# Patient Record
Sex: Female | Born: 1955 | Race: White | Hispanic: No | Marital: Married | Smoking: Former smoker
Health system: Southern US, Community
[De-identification: ages and names within clinical notes are randomized; demographics above are authoritative.]

## PROBLEM LIST (undated history)

## (undated) DIAGNOSIS — J40 Bronchitis, not specified as acute or chronic: Secondary | ICD-10-CM

## (undated) DIAGNOSIS — T7840XA Allergy, unspecified, initial encounter: Secondary | ICD-10-CM

## (undated) DIAGNOSIS — IMO0002 Reserved for concepts with insufficient information to code with codable children: Secondary | ICD-10-CM

## (undated) DIAGNOSIS — I10 Essential (primary) hypertension: Secondary | ICD-10-CM

## (undated) DIAGNOSIS — M1990 Unspecified osteoarthritis, unspecified site: Secondary | ICD-10-CM

## (undated) DIAGNOSIS — N644 Mastodynia: Secondary | ICD-10-CM

## (undated) DIAGNOSIS — F32A Depression, unspecified: Secondary | ICD-10-CM

## (undated) DIAGNOSIS — K529 Noninfective gastroenteritis and colitis, unspecified: Secondary | ICD-10-CM

## (undated) DIAGNOSIS — K469 Unspecified abdominal hernia without obstruction or gangrene: Secondary | ICD-10-CM

## (undated) DIAGNOSIS — K219 Gastro-esophageal reflux disease without esophagitis: Secondary | ICD-10-CM

## (undated) DIAGNOSIS — F329 Major depressive disorder, single episode, unspecified: Secondary | ICD-10-CM

## (undated) DIAGNOSIS — R87612 Low grade squamous intraepithelial lesion on cytologic smear of cervix (LGSIL): Secondary | ICD-10-CM

## (undated) DIAGNOSIS — M2410 Other articular cartilage disorders, unspecified site: Secondary | ICD-10-CM

## (undated) DIAGNOSIS — F419 Anxiety disorder, unspecified: Secondary | ICD-10-CM

## (undated) DIAGNOSIS — M069 Rheumatoid arthritis, unspecified: Secondary | ICD-10-CM

## (undated) DIAGNOSIS — Z8719 Personal history of other diseases of the digestive system: Secondary | ICD-10-CM

## (undated) DIAGNOSIS — L4050 Arthropathic psoriasis, unspecified: Secondary | ICD-10-CM

## (undated) DIAGNOSIS — Z8489 Family history of other specified conditions: Secondary | ICD-10-CM

## (undated) HISTORY — DX: Other articular cartilage disorders, unspecified site: M24.10

## (undated) HISTORY — DX: Essential (primary) hypertension: I10

## (undated) HISTORY — DX: Allergy, unspecified, initial encounter: T78.40XA

## (undated) HISTORY — PX: COLPOSCOPY: SHX161

## (undated) HISTORY — DX: Noninfective gastroenteritis and colitis, unspecified: K52.9

## (undated) HISTORY — DX: Reserved for concepts with insufficient information to code with codable children: IMO0002

## (undated) HISTORY — DX: Unspecified osteoarthritis, unspecified site: M19.90

## (undated) HISTORY — DX: Low grade squamous intraepithelial lesion on cytologic smear of cervix (LGSIL): R87.612

## (undated) HISTORY — PX: SPINE SURGERY: SHX786

## (undated) HISTORY — PX: JOINT REPLACEMENT: SHX530

## (undated) HISTORY — DX: Unspecified abdominal hernia without obstruction or gangrene: K46.9

## (undated) HISTORY — DX: Mastodynia: N64.4

## (undated) HISTORY — DX: Gastro-esophageal reflux disease without esophagitis: K21.9

---

## 1984-08-07 HISTORY — PX: ABDOMINAL HYSTERECTOMY: SHX81

## 2005-08-07 DIAGNOSIS — I10 Essential (primary) hypertension: Secondary | ICD-10-CM

## 2005-08-07 DIAGNOSIS — K529 Noninfective gastroenteritis and colitis, unspecified: Secondary | ICD-10-CM

## 2005-08-07 DIAGNOSIS — K469 Unspecified abdominal hernia without obstruction or gangrene: Secondary | ICD-10-CM

## 2005-08-07 HISTORY — PX: POLYPECTOMY: SHX149

## 2005-08-07 HISTORY — DX: Unspecified abdominal hernia without obstruction or gangrene: K46.9

## 2005-08-07 HISTORY — DX: Noninfective gastroenteritis and colitis, unspecified: K52.9

## 2005-08-07 HISTORY — DX: Essential (primary) hypertension: I10

## 2005-08-07 HISTORY — PX: CHOLECYSTECTOMY: SHX55

## 2005-11-27 ENCOUNTER — Ambulatory Visit: Payer: Self-pay | Admitting: Internal Medicine

## 2006-01-24 ENCOUNTER — Ambulatory Visit: Payer: Self-pay | Admitting: Internal Medicine

## 2006-01-25 ENCOUNTER — Ambulatory Visit: Payer: Self-pay | Admitting: Surgery

## 2006-04-24 ENCOUNTER — Ambulatory Visit: Payer: Self-pay | Admitting: Gastroenterology

## 2006-06-16 ENCOUNTER — Other Ambulatory Visit: Payer: Self-pay

## 2006-06-16 ENCOUNTER — Emergency Department: Payer: Self-pay | Admitting: Emergency Medicine

## 2008-08-07 HISTORY — PX: COLONOSCOPY: SHX174

## 2008-08-07 HISTORY — PX: UPPER GI ENDOSCOPY: SHX6162

## 2009-01-19 ENCOUNTER — Emergency Department (HOSPITAL_COMMUNITY): Admission: EM | Admit: 2009-01-19 | Discharge: 2009-01-19 | Payer: Self-pay | Admitting: Internal Medicine

## 2009-04-26 ENCOUNTER — Encounter: Admission: RE | Admit: 2009-04-26 | Discharge: 2009-04-26 | Payer: Self-pay | Admitting: Gastroenterology

## 2009-07-21 ENCOUNTER — Ambulatory Visit (HOSPITAL_COMMUNITY): Admission: RE | Admit: 2009-07-21 | Discharge: 2009-07-21 | Payer: Self-pay | Admitting: General Surgery

## 2009-08-23 ENCOUNTER — Emergency Department (HOSPITAL_COMMUNITY): Admission: EM | Admit: 2009-08-23 | Discharge: 2009-08-23 | Payer: Self-pay | Admitting: Emergency Medicine

## 2010-06-15 ENCOUNTER — Ambulatory Visit: Payer: Self-pay | Admitting: Family Medicine

## 2010-07-14 ENCOUNTER — Encounter
Admission: RE | Admit: 2010-07-14 | Discharge: 2010-07-14 | Payer: Self-pay | Source: Home / Self Care | Attending: Neurosurgery | Admitting: Neurosurgery

## 2010-08-03 ENCOUNTER — Encounter
Admission: RE | Admit: 2010-08-03 | Discharge: 2010-08-03 | Payer: Self-pay | Source: Home / Self Care | Attending: Neurosurgery | Admitting: Neurosurgery

## 2010-08-07 DIAGNOSIS — IMO0002 Reserved for concepts with insufficient information to code with codable children: Secondary | ICD-10-CM

## 2010-08-07 HISTORY — DX: Reserved for concepts with insufficient information to code with codable children: IMO0002

## 2010-10-09 ENCOUNTER — Ambulatory Visit: Payer: Self-pay | Admitting: Internal Medicine

## 2010-10-23 LAB — COMPREHENSIVE METABOLIC PANEL
ALT: 27 U/L (ref 0–35)
AST: 27 U/L (ref 0–37)
Albumin: 4.2 g/dL (ref 3.5–5.2)
Alkaline Phosphatase: 81 U/L (ref 39–117)
BUN: 8 mg/dL (ref 6–23)
CO2: 24 mEq/L (ref 19–32)
Calcium: 9.3 mg/dL (ref 8.4–10.5)
Chloride: 102 mEq/L (ref 96–112)
Creatinine, Ser: 0.68 mg/dL (ref 0.4–1.2)
GFR calc non Af Amer: >60 mL/min (ref >60)
Potassium: 3.4 mEq/L — ABNORMAL LOW (ref 3.5–5.1)
Total Bilirubin: 0.5 mg/dL (ref 0.3–1.2)

## 2010-10-23 LAB — POCT CARDIAC MARKERS
CKMB, poc: 1.8 ng/mL (ref 1.0–8.0)
Myoglobin, poc: 71.8 ng/mL (ref 12–200)
Troponin i, poc: <0.05 ng/mL (ref 0.00–0.09)

## 2010-10-23 LAB — LIPASE, BLOOD: Lipase: 22 U/L (ref 11–59)

## 2010-11-14 LAB — COMPREHENSIVE METABOLIC PANEL
ALT: 18 U/L (ref 0–35)
BUN: 9 mg/dL (ref 6–23)
Chloride: 100 mEq/L (ref 96–112)
Creatinine, Ser: 0.75 mg/dL (ref 0.4–1.2)
GFR calc Af Amer: >60 mL/min (ref >60)
Potassium: 3.5 mEq/L (ref 3.5–5.1)
Sodium: 133 mEq/L — ABNORMAL LOW (ref 135–145)
Total Bilirubin: 0.5 mg/dL (ref 0.3–1.2)
Total Protein: 7.7 g/dL (ref 6.0–8.3)

## 2010-11-14 LAB — LIPASE, BLOOD: Lipase: 25 U/L (ref 11–59)

## 2010-11-14 LAB — CBC
HCT: 41.3 % (ref 36.0–46.0)
MCV: 92.1 fL (ref 78.0–100.0)
Platelets: 273 10*3/uL (ref 150–400)
RBC: 4.49 MIL/uL (ref 3.87–5.11)
RDW: 12.5 % (ref 11.5–15.5)

## 2010-11-14 LAB — DIFFERENTIAL
Eosinophils Absolute: 0.1 10*3/uL (ref 0.0–0.7)
Lymphocytes Relative: 32 % (ref 12–46)
Lymphs Abs: 3.1 10*3/uL (ref 0.7–4.0)
Neutro Abs: 5.8 10*3/uL (ref 1.7–7.7)

## 2010-11-14 LAB — POCT I-STAT, CHEM 8
BUN: 10 mg/dL (ref 6–23)
Calcium, Ion: 1.06 mmol/L — ABNORMAL LOW (ref 1.12–1.32)
Chloride: 103 mEq/L (ref 96–112)
Glucose, Bld: 109 mg/dL — ABNORMAL HIGH (ref 70–99)
Hemoglobin: 14.6 g/dL (ref 12.0–15.0)
Potassium: 4.1 mEq/L (ref 3.5–5.1)
Sodium: 133 mEq/L — ABNORMAL LOW (ref 135–145)
TCO2: 24 mmol/L (ref 0–100)

## 2011-02-17 ENCOUNTER — Other Ambulatory Visit: Payer: Self-pay | Admitting: Neurosurgery

## 2011-02-17 DIAGNOSIS — M549 Dorsalgia, unspecified: Secondary | ICD-10-CM

## 2011-02-19 ENCOUNTER — Ambulatory Visit
Admission: RE | Admit: 2011-02-19 | Discharge: 2011-02-19 | Disposition: A | Discharge status: Home / Self Care | Payer: 59 | Source: Ambulatory Visit | Attending: Neurosurgery | Admitting: Neurosurgery

## 2011-02-19 DIAGNOSIS — M549 Dorsalgia, unspecified: Secondary | ICD-10-CM

## 2011-11-11 IMAGING — RF DG ESOPHAGUS
18 of 24 series · 18 of 24 positions shown · non-contrast
Comparison: CT abdomen 04/26/2009.

CLINICAL DATA: 53-year-old female who was diagnosed with a
collision on the basis of abnormal esophageal manometry.  Chest
chronic pain located between the breast with eating.  She feels
that solids and liquids thick in her esophagus.  She is a known
small hiatal hernia.

ESOPHOGRAM/BARIUM SWALLOW
TECHNIQUE: Combined double contrast and single contrast
examination performed using effervescent crystals, thick barium
liquid, and thin barium liquid.
Fluoroscopy time:  3.8 minutes.

[Series 1: run · 1 of 1 slices shown (1 of 18)]
[im 1/1]
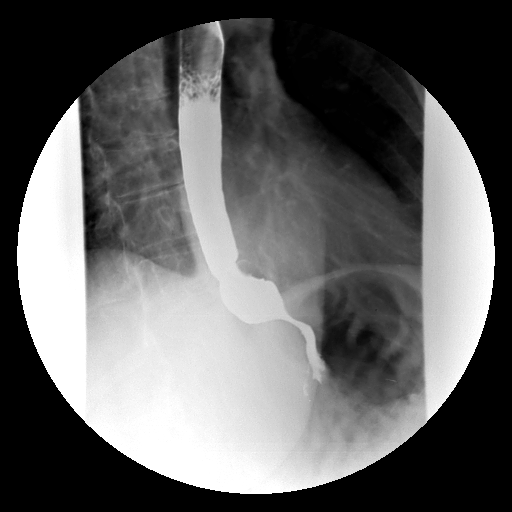

[Series 3: run · 1 of 1 slices shown (2 of 18)]
[im 1/1]
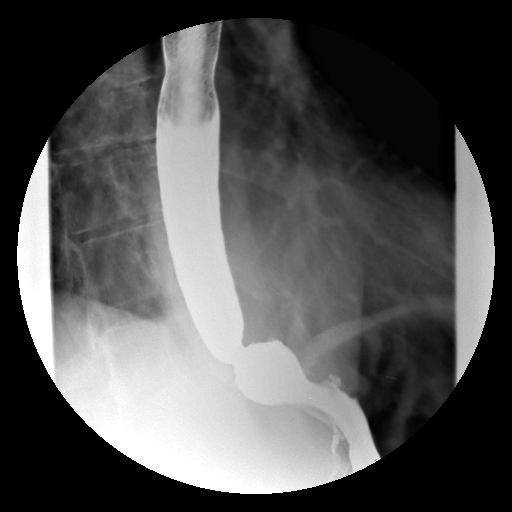

[Series 4: run · 1 of 1 slices shown (3 of 18)]
[im 1/1]
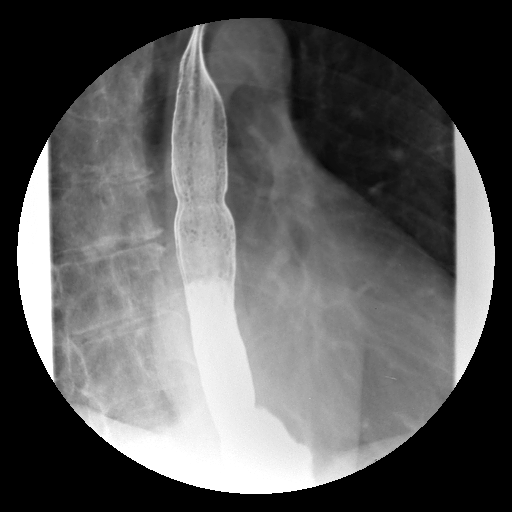

[Series 5: run · 1 of 1 slices shown (4 of 18)]
[im 1/1]
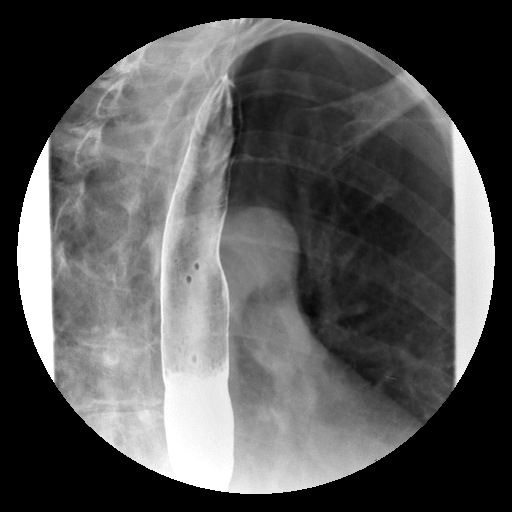

[Series 7: run · 1 of 1 slices shown (5 of 18)]
[im 1/1]
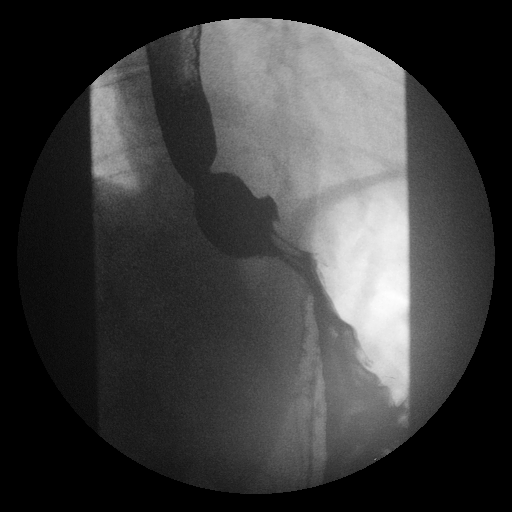

[Series 8: run · 1 of 1 slices shown (6 of 18)]
[im 1/1]
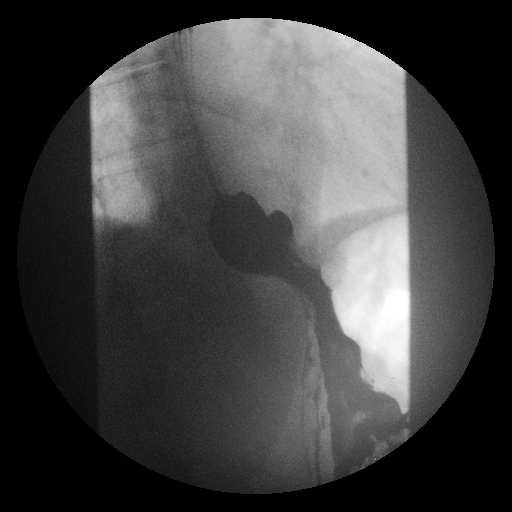

[Series 9: run · 1 of 5 slices shown (7 of 18)]
[im 1/5]
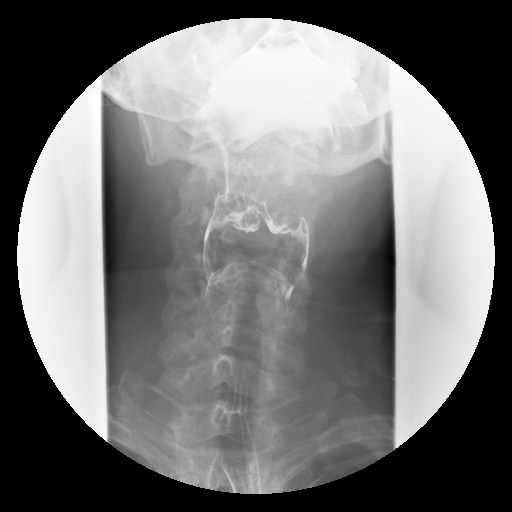

[Series 11: run · 1 of 5 slices shown (8 of 18)]
[im 1/5]
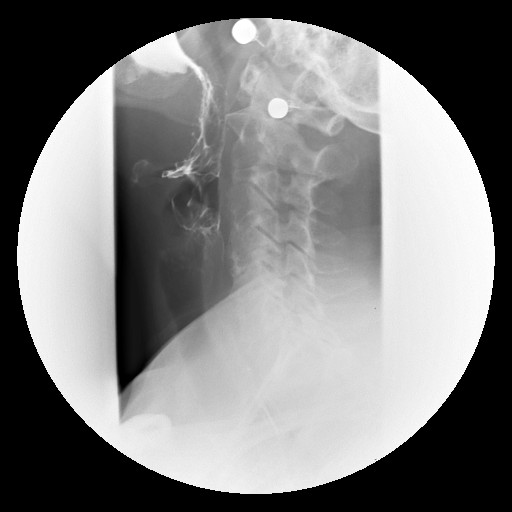

[Series 12: run · 1 of 1 slices shown (9 of 18)]
[im 1/1]
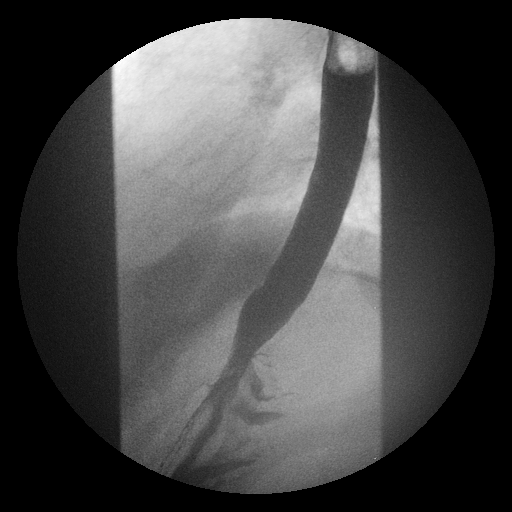

[Series 13: run · 1 of 1 slices shown (10 of 18)]
[im 1/1]
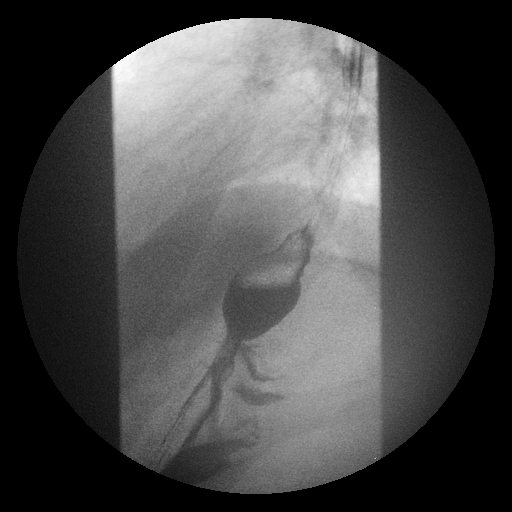

[Series 15: run · 1 of 1 slices shown (11 of 18)]
[im 1/1]
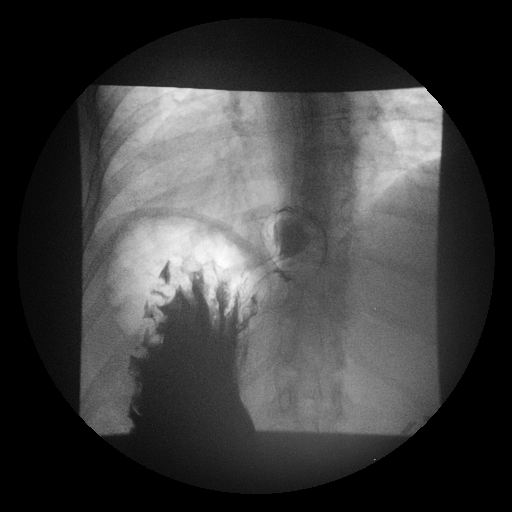

[Series 16: run · 1 of 1 slices shown (12 of 18)]
[im 1/1]
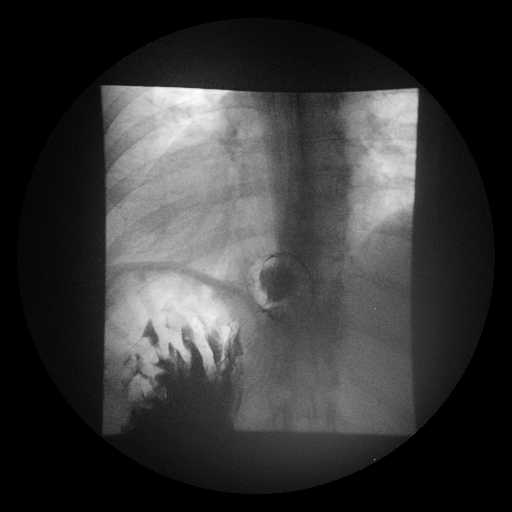

[Series 17: run · 1 of 1 slices shown (13 of 18)]
[im 1/1]
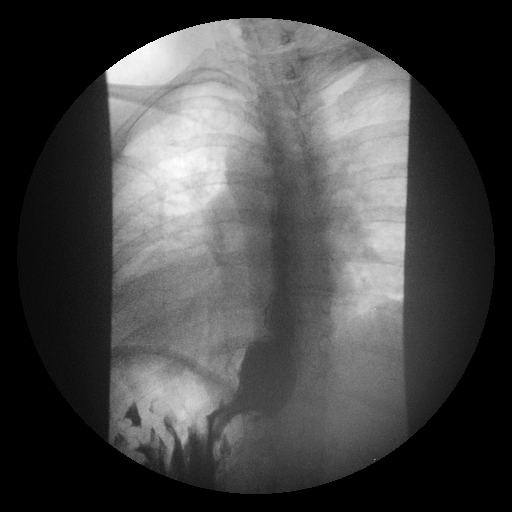

[Series 19: run · 1 of 1 slices shown (14 of 18)]
[im 1/1]
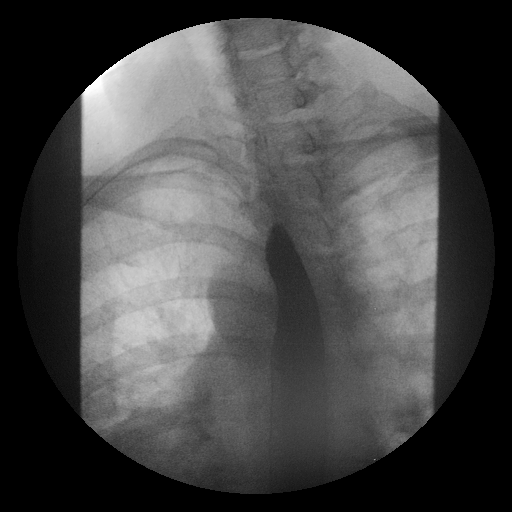

[Series 20: run · 1 of 1 slices shown (15 of 18)]
[im 1/1]
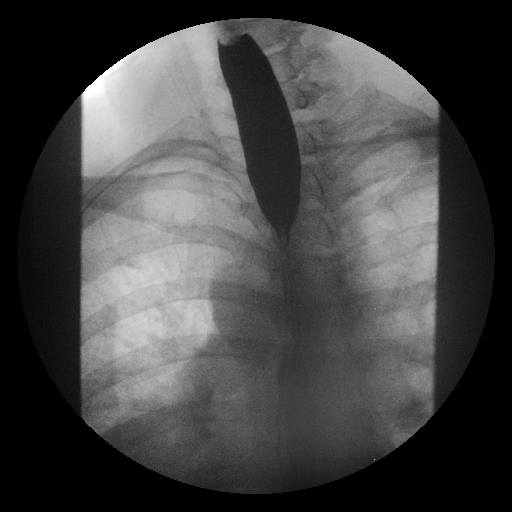

[Series 21: run · 1 of 1 slices shown (16 of 18)]
[im 1/1]
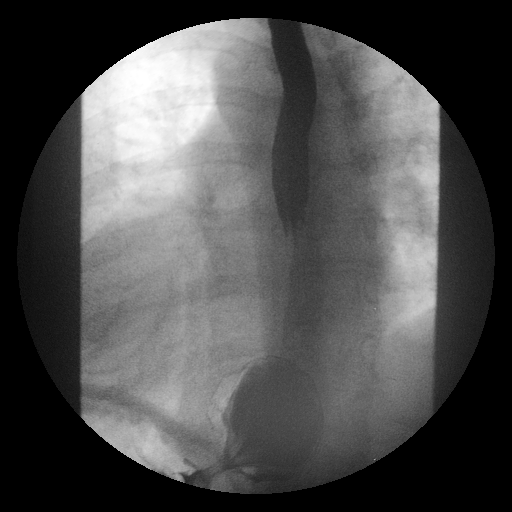

[Series 23: run · 1 of 1 slices shown (17 of 18)]
[im 1/1]
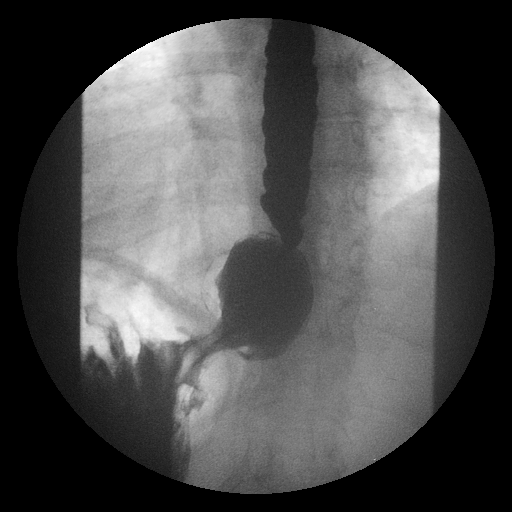

[Series 24: run · 1 of 1 slices shown (18 of 18)]
[im 1/1]
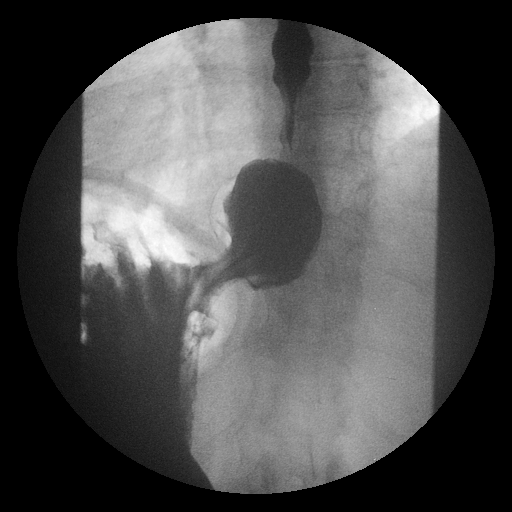

[18 of 24 positions shown; findings below may reference images not displayed]

FINDINGS: The patient tolerated the double contrast study well
without difficulty.  There is no obstruction of the forward flow of
contrast throughout the esophagus to the gastroesophageal junction.
The gastroesophageal junction is within normal limits.  Esophageal
mucosal pattern is within normal limits.

A small hiatal hernia was intermittently noted throughout the
study.  This appeared largest on the supine images of the abdomen
(image 14). This appears to be a sliding hernia, and it became
smaller and ultimately resolved at the conclusion of the study on
the upright images (image 36).  However, when present, contrast did
hang up in the hernia, and the neck of the hernia appeared
relatively stenotic.  A 12.5 mm barium tablet paused at the level
of the aortic arch impression in the upper thoracic esophagus, but
then passed immediately into the stomach.

No gastroesophageal reflux was elicited despite maneuvers.  Rapid
sequence imaging of the cervical esophagus is normal.  Esophageal
motility is within normal limits.  Visualized gastric contour is
unremarkable.  There is gastric emptying to the small bowel noted.
IMPRESSION: 1.  Intermittently seen (sliding) hiatal hernia, largest during
supine positioning.  While the most often these are thought to be
asymptomatic, there was mild obstruction of the forward flow of
contrast at the hernia neck.
2.  Normal esophagus and normal gastroesophageal junction.
3.  No gastroesophageal reflux elicited.

## 2012-02-01 DIAGNOSIS — IMO0002 Reserved for concepts with insufficient information to code with codable children: Secondary | ICD-10-CM

## 2012-02-01 HISTORY — DX: Reserved for concepts with insufficient information to code with codable children: IMO0002

## 2012-07-18 DIAGNOSIS — R87612 Low grade squamous intraepithelial lesion on cytologic smear of cervix (LGSIL): Secondary | ICD-10-CM

## 2012-07-18 HISTORY — DX: Low grade squamous intraepithelial lesion on cytologic smear of cervix (LGSIL): R87.612

## 2012-08-07 DIAGNOSIS — M1990 Unspecified osteoarthritis, unspecified site: Secondary | ICD-10-CM

## 2012-08-07 HISTORY — DX: Unspecified osteoarthritis, unspecified site: M19.90

## 2012-10-22 ENCOUNTER — Other Ambulatory Visit: Payer: Self-pay | Admitting: Neurosurgery

## 2012-10-22 DIAGNOSIS — M47816 Spondylosis without myelopathy or radiculopathy, lumbar region: Secondary | ICD-10-CM

## 2012-10-24 ENCOUNTER — Ambulatory Visit
Admission: RE | Admit: 2012-10-24 | Discharge: 2012-10-24 | Disposition: A | Discharge status: Home / Self Care | Payer: 59 | Source: Ambulatory Visit | Attending: Neurosurgery | Admitting: Neurosurgery

## 2012-10-24 ENCOUNTER — Other Ambulatory Visit: Payer: 59

## 2012-10-24 DIAGNOSIS — M47816 Spondylosis without myelopathy or radiculopathy, lumbar region: Secondary | ICD-10-CM

## 2012-10-24 MED ORDER — IOHEXOL 180 MG/ML  SOLN
1.0000 mL | Freq: Once | INTRAMUSCULAR | Status: AC | PRN
  Administered 2012-10-24: 1 mL via EPIDURAL

## 2012-12-27 HISTORY — PX: BACK SURGERY: SHX140

## 2013-01-12 ENCOUNTER — Ambulatory Visit: Payer: Self-pay | Admitting: Internal Medicine

## 2013-03-25 ENCOUNTER — Ambulatory Visit: Payer: Self-pay | Admitting: General Surgery

## 2013-06-13 ENCOUNTER — Ambulatory Visit
Admission: RE | Admit: 2013-06-13 | Discharge: 2013-06-13 | Disposition: A | Discharge status: Home / Self Care | Payer: 59 | Source: Ambulatory Visit | Attending: Neurosurgery | Admitting: Neurosurgery

## 2013-06-13 ENCOUNTER — Other Ambulatory Visit: Payer: Self-pay | Admitting: Neurosurgery

## 2013-06-13 DIAGNOSIS — M25551 Pain in right hip: Secondary | ICD-10-CM

## 2013-06-13 DIAGNOSIS — M76899 Other specified enthesopathies of unspecified lower limb, excluding foot: Secondary | ICD-10-CM

## 2013-08-24 ENCOUNTER — Ambulatory Visit: Payer: Self-pay | Admitting: Physician Assistant

## 2014-07-06 ENCOUNTER — Ambulatory Visit: Payer: Self-pay | Admitting: Podiatry

## 2014-07-28 ENCOUNTER — Ambulatory Visit: Payer: Self-pay

## 2014-07-28 LAB — RAPID STREP-A WITH REFLX: Micro Text Report: NEGATIVE

## 2014-07-29 ENCOUNTER — Ambulatory Visit: Payer: Self-pay | Admitting: Podiatry

## 2014-07-31 LAB — BETA STREP CULTURE(ARMC)

## 2014-08-10 ENCOUNTER — Ambulatory Visit: Payer: Self-pay | Admitting: Podiatry

## 2014-08-17 ENCOUNTER — Ambulatory Visit (INDEPENDENT_AMBULATORY_CARE_PROVIDER_SITE_OTHER): Payer: 59 | Admitting: Podiatry

## 2014-08-17 ENCOUNTER — Ambulatory Visit (INDEPENDENT_AMBULATORY_CARE_PROVIDER_SITE_OTHER): Payer: 59

## 2014-08-17 ENCOUNTER — Encounter: Payer: Self-pay | Admitting: Podiatry

## 2014-08-17 VITALS — BP 121/75 | HR 70 | Resp 16 | Ht 64.0 in | Wt 175.0 lb

## 2014-08-17 DIAGNOSIS — M21612 Bunion of left foot: Secondary | ICD-10-CM

## 2014-08-17 DIAGNOSIS — M2012 Hallux valgus (acquired), left foot: Secondary | ICD-10-CM

## 2014-08-17 DIAGNOSIS — M21622 Bunionette of left foot: Secondary | ICD-10-CM

## 2014-08-17 DIAGNOSIS — M205X2 Other deformities of toe(s) (acquired), left foot: Secondary | ICD-10-CM

## 2014-08-17 DIAGNOSIS — M2042 Other hammer toe(s) (acquired), left foot: Secondary | ICD-10-CM

## 2014-08-17 DIAGNOSIS — G5762 Lesion of plantar nerve, left lower limb: Secondary | ICD-10-CM

## 2014-08-17 DIAGNOSIS — G5782 Other specified mononeuropathies of left lower limb: Secondary | ICD-10-CM

## 2014-08-17 NOTE — Progress Notes (Signed)
   Subjective:    Patient ID: Breanna Farrell, female    DOB: Feb 19, 1956, 59 y.o.   MRN: [id number]  HPI Comments: i have bunions on my left foot. Ive had them for years. Shoes will hurt my foot. My foot will draw up and my toes are turning like hammer toes. It does hurt to walk, stand and sit. Its a constant pain. My foot is getting worse. i try to stay off my foot.     Review of Systems  HENT: Positive for sinus pressure.   All other systems reviewed and are negative.      Objective:   Physical Exam: I have reviewed her past medical history medications out a surgery social history and review of systems. Pulses are strongly palpable bilateral. Neurologic sensorium is intact per Semmes-Weinstein monofilament. Deep tendon reflexes are intact bilateral muscle strength +5 over 5 dorsiflexes plantar flexors and inverters everters onto the musculature is intact. Orthopedic evaluation does demonstrate hallux abductovalgus deformity left greater than right tailor's bunion deformity left greater than right. Palpable Mulder's click third interdigital space of the left foot. 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 Radiographically evaluation confirms these deformities. Cutaneous evaluation demonstrates supple well-hydrated cutis no erythema edema cellulitis drainage odor or skin breakdown.        Assessment & Plan:  Assessment: Hallux valgus left. 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  Plan: We discussed the etiology pathology conservative versus surgical therapies. I encouraged an injection to the neuroma. However this point due to the inability for her to continue daily activities with her foot in this manner she is requesting surgical intervention. We went over a consent form today line by line number by number giving her ample time to ask questions she suffered regarding [place] bunion repair left foot fifth  metatarsal osteotomy left foot. Neurectomy heard interdigital space left foot. Hammertoe repair with screws or pins second and third toes left foot. I answered all the questions regarding these procedure to the best of my ability in layman's terms area they understood it were amenable to it and signed all 3 pages of the consent form. She was dispensed a cam walker for her left lower extremity and I will follow-up with her in the near future.

## 2014-09-30 ENCOUNTER — Other Ambulatory Visit: Payer: Self-pay | Admitting: Podiatry

## 2014-09-30 MED ORDER — OXYCODONE-ACETAMINOPHEN 10-325 MG PO TABS: ORAL_TABLET | ORAL | Status: DC

## 2014-09-30 MED ORDER — PROMETHAZINE HCL 25 MG PO TABS: 25.0000 mg | ORAL_TABLET | Freq: Three times a day (TID) | ORAL | Status: DC | PRN

## 2014-09-30 MED ORDER — CEPHALEXIN 500 MG PO CAPS: 500.0000 mg | ORAL_CAPSULE | Freq: Three times a day (TID) | ORAL | Status: DC

## 2014-10-02 ENCOUNTER — Encounter: Payer: Self-pay | Admitting: Podiatry

## 2014-10-02 DIAGNOSIS — M21542 Acquired clubfoot, left foot: Secondary | ICD-10-CM

## 2014-10-02 DIAGNOSIS — M2012 Hallux valgus (acquired), left foot: Secondary | ICD-10-CM

## 2014-10-02 DIAGNOSIS — M2042 Other hammer toe(s) (acquired), left foot: Secondary | ICD-10-CM

## 2014-10-07 ENCOUNTER — Ambulatory Visit (INDEPENDENT_AMBULATORY_CARE_PROVIDER_SITE_OTHER): Payer: 59 | Admitting: Podiatry

## 2014-10-07 ENCOUNTER — Ambulatory Visit (INDEPENDENT_AMBULATORY_CARE_PROVIDER_SITE_OTHER): Payer: 59

## 2014-10-07 VITALS — BP 165/78 | HR 56 | Temp 98.3°F | Resp 18

## 2014-10-07 DIAGNOSIS — Z9889 Other specified postprocedural states: Secondary | ICD-10-CM

## 2014-10-07 DIAGNOSIS — M2012 Hallux valgus (acquired), left foot: Secondary | ICD-10-CM

## 2014-10-07 DIAGNOSIS — M21612 Bunion of left foot: Secondary | ICD-10-CM

## 2014-10-07 NOTE — Progress Notes (Signed)
She presents today for follow-up 1 week status post [place] bunion repair left hammertoe repair #2 #3 with pain in the left neurectomy third interdigital space left and a fifth metatarsal osteotomy left. She denies fever chills nausea vomiting. She states that she's had considerable pain about the left foot.  Objective: She presents today ambulating slowly. Vital signs are stable she is alert and oriented 3. Pulses are palpable once a dry sterile dressing was removed. Minimal edema no erythema cellulitis drainage or odor K wires are in place sutures are in good position and well coapted margins. Radiograph demonstrates well-healing surgical foot.  Assessment: Well-healing surgical foot left.  Plan: I will follow-up with her in 1 week at this point we redressed the foot today with a dry sterile compressive dressing consider suture removal next visit.

## 2014-10-14 ENCOUNTER — Encounter: Payer: Self-pay | Admitting: Podiatry

## 2014-10-14 ENCOUNTER — Ambulatory Visit (INDEPENDENT_AMBULATORY_CARE_PROVIDER_SITE_OTHER): Payer: 59 | Admitting: Podiatry

## 2014-10-14 VITALS — BP 111/61 | HR 80 | Resp 16

## 2014-10-14 DIAGNOSIS — M2012 Hallux valgus (acquired), left foot: Secondary | ICD-10-CM

## 2014-10-14 DIAGNOSIS — Z9889 Other specified postprocedural states: Secondary | ICD-10-CM

## 2014-10-14 DIAGNOSIS — M205X2 Other deformities of toe(s) (acquired), left foot: Secondary | ICD-10-CM

## 2014-10-14 DIAGNOSIS — M2042 Other hammer toe(s) (acquired), left foot: Secondary | ICD-10-CM

## 2014-10-14 DIAGNOSIS — M21622 Bunionette of left foot: Secondary | ICD-10-CM

## 2014-10-14 MED ORDER — PROMETHAZINE HCL 25 MG PO TABS: 25.0000 mg | ORAL_TABLET | Freq: Three times a day (TID) | ORAL | Status: DC | PRN

## 2014-10-14 MED ORDER — HYDROCODONE-ACETAMINOPHEN 5-325 MG PO TABS: 1.0000 | ORAL_TABLET | Freq: Four times a day (QID) | ORAL | Status: DC | PRN

## 2014-10-14 NOTE — Progress Notes (Signed)
She presents today 2 weeks status post osteotomy bunion repair fifth metatarsal osteotomy and hammertoe repair #2 #3 of the left foot. She denies fever chills nausea by muscle aches and pains that she states the Percocet seems to be bothering her stomach a little bit.  Objective: Vital signs are stable she is alert and oriented 3 presents in her boot today with a dry sterile dressing intact. Was removed demonstrates no erythema saline as drainage or odor. Pins are intact to his #2 and 3 left foot. Sutures are intact margins appear to be well coapted. Sutures were removed today.  Assessment: Well-healing surgical toes and foot left.  Plan: The compression anklet [place] she will follow up with her in 1-2 weeks and wrote another prescription for Vicodin and Phenergan.

## 2014-10-23 ENCOUNTER — Encounter: Payer: Self-pay | Admitting: Podiatry

## 2014-10-26 ENCOUNTER — Ambulatory Visit (INDEPENDENT_AMBULATORY_CARE_PROVIDER_SITE_OTHER): Payer: 59

## 2014-10-26 ENCOUNTER — Ambulatory Visit (INDEPENDENT_AMBULATORY_CARE_PROVIDER_SITE_OTHER): Payer: 59 | Admitting: Podiatry

## 2014-10-26 VITALS — BP 130/75 | HR 81 | Resp 16

## 2014-10-26 DIAGNOSIS — M2012 Hallux valgus (acquired), left foot: Secondary | ICD-10-CM

## 2014-10-26 NOTE — Progress Notes (Signed)
She presents today one month status post bunion repair hammertoe repair #2 and #3 of the left foot. She's been wearing her ankle-foot the entire time since I gave it to her. She has not taken it off. She states that her foot is in excruciating pain.  Objective: Vital signs are stable she's alert and oriented 3. Pulses are palpable bilateral. She has moderate edema to the dorsal aspect of the left foot with mild tenderness. Radiographs do demonstrate well-healing osteotomy sites and arthrodesis sites.  Assessment: Painful postoperative foot left secondary to leaving the anklet on for the past 2 weeks.  Plan: I would allow her to start washing this foot and soaking it. We will leave the pins in for another 2 weeks.

## 2014-10-28 ENCOUNTER — Encounter: Payer: 59 | Admitting: Podiatry

## 2014-10-28 NOTE — Progress Notes (Signed)
DOS 10/02/2014 Austin Bunion Repair left; 5th Metata4rsal Osteotomy lef, Neurectomy 3rd interdigital space left, Hammer Toe Repair with screw/pins left 2nd, 3rd.

## 2014-11-09 ENCOUNTER — Ambulatory Visit (INDEPENDENT_AMBULATORY_CARE_PROVIDER_SITE_OTHER): Payer: 59 | Admitting: Podiatry

## 2014-11-09 ENCOUNTER — Encounter: Payer: Self-pay | Admitting: Podiatry

## 2014-11-09 ENCOUNTER — Ambulatory Visit (INDEPENDENT_AMBULATORY_CARE_PROVIDER_SITE_OTHER): Payer: 59

## 2014-11-09 VITALS — BP 123/71 | HR 75 | Resp 16 | Ht 64.0 in | Wt 175.0 lb

## 2014-11-09 DIAGNOSIS — Z9889 Other specified postprocedural states: Secondary | ICD-10-CM

## 2014-11-09 DIAGNOSIS — M2012 Hallux valgus (acquired), left foot: Secondary | ICD-10-CM

## 2014-11-09 DIAGNOSIS — M2042 Other hammer toe(s) (acquired), left foot: Secondary | ICD-10-CM

## 2014-11-09 NOTE — Progress Notes (Signed)
She presents today for follow-up left foot. She is approximately 6 weeks status post [place] bunion repair second metatarsal osteotomy with hammertoe repair #2 and #3 of the left foot with pins. She states that the foot is swollen because she's been on it and the foot still hurts with the wires in the foot.  Objective: Vital signs are stable she's alert and oriented 3. K wires are in place to the left foot toes #2 and #3 with some extrusion of the second K wire. Radiographs confirm near consolidation of the osteotomy sites and of the arthrodesis sites.  Assessment: Well-healing surgical foot left.  Plan: Removal of her K wires today compression dressing and will allow her to get started in tennis shoes after the next couple of weeks I will follow up with her in 1 month

## 2014-12-07 ENCOUNTER — Ambulatory Visit (INDEPENDENT_AMBULATORY_CARE_PROVIDER_SITE_OTHER): Payer: 59

## 2014-12-07 ENCOUNTER — Ambulatory Visit (INDEPENDENT_AMBULATORY_CARE_PROVIDER_SITE_OTHER): Payer: 59 | Admitting: Podiatry

## 2014-12-07 ENCOUNTER — Encounter: Payer: Self-pay | Admitting: Podiatry

## 2014-12-07 DIAGNOSIS — M205X2 Other deformities of toe(s) (acquired), left foot: Secondary | ICD-10-CM

## 2014-12-07 DIAGNOSIS — M2012 Hallux valgus (acquired), left foot: Secondary | ICD-10-CM | POA: Diagnosis not present

## 2014-12-07 DIAGNOSIS — Z9889 Other specified postprocedural states: Secondary | ICD-10-CM

## 2014-12-07 DIAGNOSIS — M2042 Other hammer toe(s) (acquired), left foot: Secondary | ICD-10-CM

## 2014-12-07 DIAGNOSIS — M21622 Bunionette of left foot: Secondary | ICD-10-CM

## 2014-12-07 NOTE — Progress Notes (Signed)
She presents today approximately 9 weeks status post [facility] bunion repair and hammertoe repair left. She also had a fifth metatarsal osteotomy. She states that she is having as much pain now as she was prior to surgery. She presents today with a pair of flat loosefitting shoes.  Objective: Vital signs are stable she is alert and oriented 3 she still has tenderness on range of motion of all the toes radiographs demonstrate well-healed osteotomies.  Assessment: Well-healed surgical foot left retention of swelling and edema and some erythema around the joint. This could be associated with her rheumatoid.  Plan: I encouraged her to get back on her rheumatoid medicine and I will follow-up with her in approximately a month in which time we will consider physical therapy.

## 2015-01-06 ENCOUNTER — Ambulatory Visit (INDEPENDENT_AMBULATORY_CARE_PROVIDER_SITE_OTHER): Payer: 59 | Admitting: Podiatry

## 2015-01-06 ENCOUNTER — Ambulatory Visit (INDEPENDENT_AMBULATORY_CARE_PROVIDER_SITE_OTHER): Payer: 59

## 2015-01-06 VITALS — BP 130/84 | HR 76 | Resp 16

## 2015-01-06 DIAGNOSIS — E119 Type 2 diabetes mellitus without complications: Secondary | ICD-10-CM | POA: Insufficient documentation

## 2015-01-06 DIAGNOSIS — M2012 Hallux valgus (acquired), left foot: Secondary | ICD-10-CM | POA: Diagnosis not present

## 2015-01-06 DIAGNOSIS — Z8719 Personal history of other diseases of the digestive system: Secondary | ICD-10-CM | POA: Insufficient documentation

## 2015-01-06 DIAGNOSIS — E118 Type 2 diabetes mellitus with unspecified complications: Secondary | ICD-10-CM | POA: Insufficient documentation

## 2015-01-06 DIAGNOSIS — Z9889 Other specified postprocedural states: Secondary | ICD-10-CM

## 2015-01-06 DIAGNOSIS — Z8739 Personal history of other diseases of the musculoskeletal system and connective tissue: Secondary | ICD-10-CM | POA: Insufficient documentation

## 2015-01-06 DIAGNOSIS — K209 Esophagitis, unspecified without bleeding: Secondary | ICD-10-CM | POA: Insufficient documentation

## 2015-01-06 DIAGNOSIS — I10 Essential (primary) hypertension: Secondary | ICD-10-CM | POA: Insufficient documentation

## 2015-01-06 DIAGNOSIS — E1169 Type 2 diabetes mellitus with other specified complication: Secondary | ICD-10-CM | POA: Insufficient documentation

## 2015-01-06 DIAGNOSIS — E785 Hyperlipidemia, unspecified: Secondary | ICD-10-CM | POA: Insufficient documentation

## 2015-01-06 NOTE — Progress Notes (Signed)
She presents today for final follow-up of her surgical foot left including full forefoot reconstruction. She states that she is doing much better now that she is back on her arthritis medication.  Objective: Vital signs are stable she's alert and oriented 3 much decrease in edema left foot. She has great range of motion. Radiographically her feet have gone on to heal quite nicely.  Assessment: Well-healing surgical foot left.  Plan: Follow up with An As-Needed basis.

## 2015-01-10 ENCOUNTER — Ambulatory Visit
Admission: EM | Admit: 2015-01-10 | Discharge: 2015-01-10 | Disposition: A | Discharge status: Home / Self Care | Payer: 59 | Attending: Family Medicine | Admitting: Family Medicine

## 2015-01-10 DIAGNOSIS — J069 Acute upper respiratory infection, unspecified: Secondary | ICD-10-CM | POA: Diagnosis not present

## 2015-01-10 DIAGNOSIS — Z8739 Personal history of other diseases of the musculoskeletal system and connective tissue: Secondary | ICD-10-CM

## 2015-01-10 DIAGNOSIS — J3089 Other allergic rhinitis: Secondary | ICD-10-CM

## 2015-01-10 MED ORDER — HYDROCOD POLST-CPM POLST ER 10-8 MG/5ML PO SUER: 5.0000 mL | Freq: Two times a day (BID) | ORAL | Status: DC | PRN

## 2015-01-10 MED ORDER — PREDNISONE 10 MG (21) PO TBPK: ORAL_TABLET | ORAL | Status: DC

## 2015-01-10 MED ORDER — AMOXICILLIN-POT CLAVULANATE 875-125 MG PO TABS: 1.0000 | ORAL_TABLET | Freq: Two times a day (BID) | ORAL | Status: DC

## 2015-01-10 MED ORDER — FEXOFENADINE-PSEUDOEPHED ER 180-240 MG PO TB24: 1.0000 | ORAL_TABLET | Freq: Every day | ORAL | Status: DC

## 2015-01-10 NOTE — Discharge Instructions (Signed)
If not improved next several days start the Augmentin anabiotic but cannot take with immune suppressor Allergies  Allergies may happen from anything your body is sensitive to. This may be food, medicines, pollens, chemicals, and many other things. Food allergies can be severe and deadly.  HOME CARE  If you do not know what causes a reaction, keep a diary. Write down the foods you ate and the symptoms that followed. Avoid foods that cause reactions.  If you have red raised spots (hives) or a rash:  Take medicine as told by your doctor.  Use medicines for red raised spots and itching as needed.  Apply cold cloths (compresses) to the skin. Take a cool bath. Avoid hot baths or showers.  If you are severely allergic:  It is often necessary to go to the hospital after you have treated your reaction.  Wear your medical alert jewelry.  You and your family must learn how to give a allergy shot or use an allergy kit (anaphylaxis kit).  Always carry your allergy kit or shot with you. Use this medicine as told by your doctor if a severe reaction is occurring. GET HELP RIGHT AWAY IF:  You have trouble breathing or are making high-pitched whistling sounds (wheezing).  You have a tight feeling in your chest or throat.  You have a puffy (swollen) mouth.  You have red raised spots, puffiness (swelling), or itching all over your body.  You have had a severe reaction that was helped by your allergy kit or shot. The reaction can return once the medicine has worn off.  You think you are having a food allergy. Symptoms most often happen within 30 minutes of eating a food.  Your symptoms have not gone away within 2 days or are getting worse.  You have new symptoms.  You want to retest yourself with a food or drink you think causes an allergic reaction. Only do this under the care of a doctor. MAKE SURE YOU:   Understand these instructions.  Will watch your condition.  Will get help right  away if you are not doing well or get worse. Document Released: 11/18/2012 Document Reviewed: 11/18/2012 [facility] Patient Information 2015 [place]. This information is not intended to replace advice given to you by your health care provider. Make sure you discuss any questions you have with your health care provider.  Cough, Adult  A cough is a reflex. It helps you clear your throat and airways. A cough can help heal your body. A cough can last 2 or 3 weeks (acute) or may last more than 8 weeks (chronic). Some common causes of a cough can include an infection, allergy, or a cold. HOME CARE  Only take medicine as told by your doctor.  If given, take your medicines (antibiotics) as told. Finish them even if you start to feel better.  Use a cold steam vaporizer or humidifier in your home. This can help loosen thick spit (secretions).  Sleep so you are almost sitting up (semi-upright). Use pillows to do this. This helps reduce coughing.  Rest as needed.  Stop smoking if you smoke. GET HELP RIGHT AWAY IF:  You have yellowish-white fluid (pus) in your thick spit.  Your cough gets worse.  Your medicine does not reduce coughing, and you are losing sleep.  You cough up blood.  You have trouble breathing.  Your pain gets worse and medicine does not help.  You have a fever. MAKE SURE YOU:   Understand  these instructions.  Will watch your condition.  Will get help right away if you are not doing well or get worse. Document Released: 04/06/2011 Document Revised: 12/08/2013 Document Reviewed: 04/06/2011 [facility] Patient Information 2015 [place], [place]. This information is not intended to replace advice given to you by your health care provider. Make sure you discuss any questions you have with your health care provider.  Upper Respiratory Infection, Adult An upper respiratory infection (URI) is also known as the common cold. It is often caused by a type of germ (virus). Colds  are easily spread (contagious). You can pass it to others by kissing, coughing, sneezing, or drinking out of the same glass. Usually, you get better in 1 or 2 weeks.  HOME CARE   Only take medicine as told by your doctor.  Use a warm mist humidifier or breathe in steam from a hot shower.  Drink enough water and fluids to keep your pee (urine) clear or pale yellow.  Get plenty of rest.  Return to work when your temperature is back to normal or as told by your doctor. You may use a face mask and wash your hands to stop your cold from spreading. GET HELP RIGHT AWAY IF:   After the first few days, you feel you are getting worse.  You have questions about your medicine.  You have chills, shortness of breath, or brown or red spit (mucus).  You have yellow or brown snot (nasal discharge) or pain in the face, especially when you bend forward.  You have a fever, puffy (swollen) neck, pain when you swallow, or white spots in the back of your throat.  You have a bad headache, ear pain, sinus pain, or chest pain.  You have a high-pitched whistling sound when you breathe in and out (wheezing).  You have a lasting cough or cough up blood.  You have sore muscles or a stiff neck. MAKE SURE YOU:   Understand these instructions.  Will watch your condition.  Will get help right away if you are not doing well or get worse. Document Released: 01/10/2008 Document Revised: 10/16/2011 Document Reviewed: 10/29/2013 [facility] Patient Information 2015 [place], [place]. This information is not intended to replace advice given to you by your health care provider. Make sure you discuss any questions you have with your health care provider. Marland Kitchen

## 2015-01-10 NOTE — ED Notes (Signed)
Patient with complaint of nasal drainage, cough, congestion for the past week off and on. Last night felt it more in chest.

## 2015-01-10 NOTE — ED Provider Notes (Signed)
CSN: [id number]     Arrival date & time 01/10/15  1115 History   First MD Initiated Contact with Patient 01/10/15 1157     Chief Complaint  Patient presents with  . Cough  . Nasal Congestion   (Consider location/radiation/quality/duration/timing/severity/associated sxs/prior Treatment) Patient is a 59 y.o. female presenting with cough and URI. The history is provided by the patient. No language interpreter was used.  Cough Cough characteristics:  Non-productive Severity:  Moderate Duration:  2 days Timing:  Intermittent Chronicity:  New Smoker: no   Context comment:  She is on immune modifier Relieved by:  Nothing Associated symptoms: rhinorrhea   Associated symptoms: no shortness of breath   Risk factors: no recent infection   URI Presenting symptoms: congestion, cough and rhinorrhea   Rhinorrhea:    Quality:  Clear   Severity:  Severe   Duration:  1 week   Timing:  Intermittent   Progression:  Worsening Severity:  Moderate Timing:  Constant Progression:  Worsening Relieved by:  Nothing Risk factors: immunosuppression   Risk factors: not elderly, no chronic cardiac disease, no chronic kidney disease, no chronic respiratory disease, no diabetes mellitus, no recent illness, no recent travel and no sick contacts     Patient states about a week ago started. Clear rhinorrhea she thinks is mostly allergies but is getting worse and is going down to the lungs as well. He is on immune modifier.  Past Medical History  Diagnosis Date  . 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 Hernia 2007    hiatal  . Hypertension 2007  . Heart murmur 2010  . Colitis 2007  . Lump or mass in breast   . Bulging discs 2012   Past Surgical History  Procedure Laterality Date  . Polypectomy  2007    one  . Colonoscopy  2010  . Upper gi endoscopy  2010  . Cholecystectomy  2007  . Abdominal hysterectomy  1986  . Cesarean section  1981   Family History  Problem Relation  Age of Onset  . Lung cancer Maternal Grandmother     Event organiser  . Leukemia Paternal Grandmother   . Ovarian cancer Maternal Grandmother     Event organiser  . Melanoma Mother    History  Substance Use Topics  . Smoking status: Former Smoker -- 0.70 packs/day for 20 years    Types: Cigarettes    Quit date: 10/24/2008  . Smokeless tobacco: Never Used  . Alcohol Use: 0.0 oz/week    0 Standard drinks or equivalent per week   OB History    No data available     Review of Systems  HENT: Positive for congestion and rhinorrhea.   Respiratory: Positive for cough. Negative for shortness of breath.   All other systems reviewed and are negative.   Allergies  Hepatitis b vaccine; Lyrica; and Tramadol  Home Medications   Prior to Admission medications   Medication Sig Start Date End Date Taking? Authorizing Provider  cetirizine (ZYRTEC) 10 MG tablet Take 10 mg by mouth daily.   Yes Historical Provider, MD  RABEprazole (ACIPHEX) 20 MG tablet Take 20 mg by mouth daily.   Yes Historical Provider, MD  ALPRAZolam Duanne Moron) 0.5 MG tablet Take 0.5 mg by mouth at bedtime as needed for anxiety.    Historical Provider, MD  aspirin 81 MG tablet Take 81 mg by mouth daily.    Historical Provider, MD  Biotin 2.5 MG TABS Take by mouth.  Historical Provider, MD  Cetirizine HCl 10 MG CAPS Take by mouth.    Historical Provider, MD  Cholecalciferol (VITAMIN D-3 PO) Take by mouth daily.    Historical Provider, MD  Chromium Picolinate 200 MCG CAPS Take by mouth.    Historical Provider, MD  Cinnamon 500 MG capsule Take by mouth.    Historical Provider, MD  diltiazem (CARDIZEM CD) 180 MG 24 hr capsule Take by mouth.    Historical Provider, MD  meloxicam (MOBIC) 15 MG tablet  08/06/14   Historical Provider, MD  Omega-3 Fatty Acids (SM FISH OIL) 1000 MG CAPS Take by mouth.    Historical Provider, MD  ORENCIA 125 MG/ML SOSY  12/24/14   Historical Provider, MD  predniSONE (STERAPRED UNI-PAK 48 TAB) 5  MG (48) TBPK tablet See admin instructions. 12/24/14   Historical Provider, MD   BP 122/67 mmHg  Pulse 77  Temp(Src) 97.7 F (36.5 C) (Tympanic)  Resp 20  Ht 5\' 4"  (1.626 m)  Wt 174 lb (78.926 kg)  BMI 29.85 kg/m2  SpO2 98% Physical Exam  Constitutional: She is oriented to person, place, and time. She appears well-developed and well-nourished.  HENT:  Head: Normocephalic and atraumatic.  Right Ear: Hearing, tympanic membrane, external ear and ear canal normal.  Left Ear: Hearing, tympanic membrane, external ear and ear canal normal. No decreased hearing is noted.  Nose: Mucosal edema and rhinorrhea present. Right sinus exhibits no maxillary sinus tenderness and no frontal sinus tenderness. Left sinus exhibits no maxillary sinus tenderness and no frontal sinus tenderness.  Mouth/Throat: Posterior oropharyngeal erythema present.    Neck: Neck supple.  Cardiovascular: Normal rate and regular rhythm.   Pulmonary/Chest: Effort normal and breath sounds normal.  Musculoskeletal: Normal range of motion.  Neurological: She is alert and oriented to person, place, and time.  Skin: Skin is warm.  Psychiatric: Her behavior is normal.    ED Course  Procedures (including critical care time) Labs Review Labs Reviewed - No data to display  Imaging Review No results found.  We discussed options. Patient's on an immune modifier. Explained to her that not having a fever could be part of the suppression immune system. She really does want to be on anabiotic since she cannot take her immune modifier. Will treat with Allegra-D, Tussionex and 6 day course of prednisone  (she has recently taken two 12 day courses before this present illness), we have her dated prescription Augmentin but if the sputum becomes yellow-green or her symptoms come worse she can get filled between June 6 and July 6.  MDM   1. Environmental and seasonal allergies   2. URI (upper respiratory infection)   3. History of  rheumatoid arthritis        Frederich Cha, MD 01/10/15 1249

## 2015-02-18 LAB — HEPATIC FUNCTION PANEL
ALT: 17 U/L (ref 7–35)
AST: 16 U/L (ref 13–35)
Alkaline Phosphatase: 98 U/L (ref 25–125)
Bilirubin, Total: 0.3 mg/dL

## 2015-02-18 LAB — BASIC METABOLIC PANEL
BUN: 13 mg/dL (ref 4–21)
Creatinine: 0.8 mg/dL (ref 0.5–1.1)
POTASSIUM: 5 mmol/L (ref 3.4–5.3)
Sodium: 140 mmol/L (ref 137–147)

## 2015-02-18 LAB — CBC AND DIFFERENTIAL
HCT: 42 % (ref 36–46)
HEMOGLOBIN: 14 g/dL (ref 12.0–16.0)
Platelets: 281 10*3/uL (ref 150–399)
WBC: 11.1 10*3/mL

## 2015-03-16 ENCOUNTER — Encounter: Payer: Self-pay | Admitting: Family Medicine

## 2015-03-16 ENCOUNTER — Other Ambulatory Visit: Payer: Self-pay | Admitting: Family Medicine

## 2015-03-16 ENCOUNTER — Encounter (INDEPENDENT_AMBULATORY_CARE_PROVIDER_SITE_OTHER): Payer: Self-pay

## 2015-03-16 ENCOUNTER — Ambulatory Visit (INDEPENDENT_AMBULATORY_CARE_PROVIDER_SITE_OTHER): Payer: 59 | Admitting: Family Medicine

## 2015-03-16 VITALS — BP 122/80 | HR 76 | Temp 98.0°F | Ht 64.5 in | Wt 176.4 lb

## 2015-03-16 DIAGNOSIS — Z7952 Long term (current) use of systemic steroids: Secondary | ICD-10-CM | POA: Diagnosis not present

## 2015-03-16 DIAGNOSIS — R7303 Prediabetes: Secondary | ICD-10-CM

## 2015-03-16 DIAGNOSIS — R7309 Other abnormal glucose: Secondary | ICD-10-CM | POA: Diagnosis not present

## 2015-03-16 DIAGNOSIS — Z8739 Personal history of other diseases of the musculoskeletal system and connective tissue: Secondary | ICD-10-CM

## 2015-03-16 DIAGNOSIS — E785 Hyperlipidemia, unspecified: Secondary | ICD-10-CM

## 2015-03-16 DIAGNOSIS — Z23 Encounter for immunization: Secondary | ICD-10-CM | POA: Diagnosis not present

## 2015-03-16 DIAGNOSIS — M069 Rheumatoid arthritis, unspecified: Secondary | ICD-10-CM

## 2015-03-16 DIAGNOSIS — R030 Elevated blood-pressure reading, without diagnosis of hypertension: Secondary | ICD-10-CM

## 2015-03-16 DIAGNOSIS — IMO0001 Reserved for inherently not codable concepts without codable children: Secondary | ICD-10-CM

## 2015-03-16 DIAGNOSIS — L4050 Arthropathic psoriasis, unspecified: Secondary | ICD-10-CM | POA: Insufficient documentation

## 2015-03-16 DIAGNOSIS — E663 Overweight: Secondary | ICD-10-CM | POA: Diagnosis not present

## 2015-03-16 DIAGNOSIS — Z0000 Encounter for general adult medical examination without abnormal findings: Secondary | ICD-10-CM

## 2015-03-16 DIAGNOSIS — Z860100 Personal history of colon polyps, unspecified: Secondary | ICD-10-CM | POA: Insufficient documentation

## 2015-03-16 LAB — COMPREHENSIVE METABOLIC PANEL
ALK PHOS: 81 U/L (ref 39–117)
ALT: 21 U/L (ref 0–35)
AST: 24 U/L (ref 0–37)
Albumin: 4.7 g/dL (ref 3.5–5.2)
BUN: 15 mg/dL (ref 6–23)
CALCIUM: 9.6 mg/dL (ref 8.4–10.5)
CHLORIDE: 104 meq/L (ref 96–112)
CO2: 27 mEq/L (ref 19–32)
Creatinine, Ser: 0.86 mg/dL (ref 0.40–1.20)
GFR: 71.19 mL/min (ref >60.00)
Glucose, Bld: 93 mg/dL (ref 70–99)
POTASSIUM: 3.9 meq/L (ref 3.5–5.1)
SODIUM: 139 meq/L (ref 135–145)
Total Bilirubin: 0.5 mg/dL (ref 0.2–1.2)
Total Protein: 7.7 g/dL (ref 6.0–8.3)

## 2015-03-16 LAB — CBC
HEMATOCRIT: 44.4 % (ref 36.0–46.0)
HEMOGLOBIN: 14.9 g/dL (ref 12.0–15.0)
MCHC: 33.6 g/dL (ref 30.0–36.0)
MCV: 89.9 fl (ref 78.0–100.0)
Platelets: 288 10*3/uL (ref 150.0–400.0)
RBC: 4.94 Mil/uL (ref 3.87–5.11)
RDW: 13.5 % (ref 11.5–15.5)
WBC: 11.8 10*3/uL — AB (ref 4.0–10.5)

## 2015-03-16 LAB — LIPID PANEL
Cholesterol: 270 mg/dL — ABNORMAL HIGH (ref 0–200)
HDL: 44.6 mg/dL (ref >39.00)
NonHDL: 225.57
Total CHOL/HDL Ratio: 6
Triglycerides: 393 mg/dL — ABNORMAL HIGH (ref 0.0–149.0)
VLDL: 78.6 mg/dL — ABNORMAL HIGH (ref 0.0–40.0)

## 2015-03-16 LAB — HEMOGLOBIN A1C: HEMOGLOBIN A1C: 6 % (ref 4.6–6.5)

## 2015-03-16 LAB — LDL CHOLESTEROL, DIRECT: Direct LDL: 153 mg/dL

## 2015-03-16 NOTE — Patient Instructions (Signed)
It was nice to see you today.  We will call with the results of your labs.  Continue your current medications (you may stop the supplements).  Take Vitamin D and Calcium daily.   Follow up in 6 months.  Take care  Dr. Lacinda Axon

## 2015-03-16 NOTE — Progress Notes (Signed)
Pre visit review using our clinic review tool, if applicable. No additional management support is needed unless otherwise documented below in the visit note. 

## 2015-03-16 NOTE — Assessment & Plan Note (Signed)
Obtaining Lipid panel today. Will discuss statin therapy pending the results.

## 2015-03-16 NOTE — Progress Notes (Signed)
Subjective:  Patient ID: Breanna Farrell, female    DOB: 12/22/1955  Age: 59 y.o. MRN: [id number]  CC: Establish Care  HPI JUDEA Farrell is a 59 year old female who presents to the clinic today to establish care and for an annual physical exam.  1) Preventative Healthcare  Pap smear: UTD; 01/2015  Mammogram/Breast exam: 01/2015.  Colonoscopy: Reported done in 2013; Hx of colitis.   Immunizations: In need of Pneumovax (given immunosuppression). Not a candidate for Zostavax given immunosuppression with chronic steroid use.   Labs: In need of A1C (given history of prediabetes and steroid use), lipid panel, CBC, CMP.  Alcohol use: socially.   Smoking/tobacco use: Former smoker  2) Rheumatoid arthritis  Followed by Rheumatology. Stable currently on Prednisone.  3) Elevated BP and Esophageal spasm  Patient with a history of elevated blood pressure which was attributed to steroid-induced.  Patient also has a history of esophageal spasm.  Symptoms and BP is currently well controlled on diltiazem.  PMH, Surgical Hx, Family Hx, Social History reviewed and updated as below. Past Medical History  Diagnosis Date  . Diabetes mellitus without complication 0737    boarderline  . Hernia 2007    hiatal  . Hypertension 2007  . Heart murmur 2010  . Colitis 2007    ulcerative  . Lump or mass in breast   . Bulging discs 2012  . Pap smear abnormality of cervix with LGSIL 07/18/2012    BVD  . Articular cartilage disorder     bulging & herniated disc in lower back  . Breast tenderness   . Arthritis 2014    rheumatoid  . Esophageal reflux   . Abnormal cervical Pap smear with positive HPV DNA test 02/01/12  . Benign essential hypertension     Past Surgical History  Procedure Laterality Date  . Polypectomy  2007    one  . Colonoscopy  2010  . Upper gi endoscopy  2010  . Cesarean section  1981  . Back surgery  12/27/2012    [facility]  . Cesarean section  1981   [facility]  . Colposcopy    . Cholecystectomy  2007    [facility]  . Abdominal hysterectomy  1986    [name]    Family History  Problem Relation Age of Onset  . Lung cancer Maternal Grandmother     Event organiser  . Ovarian cancer Maternal Grandmother     Event organiser  . Leukemia Paternal Grandmother   . Melanoma Mother   . Hypertension Mother   . Ovarian cancer Maternal Aunt   . Heart disease Maternal Grandfather     History  Substance Use Topics  . Smoking status: Former Smoker -- 0.70 packs/day for 20 years    Types: Cigarettes    Quit date: 10/24/2008  . Smokeless tobacco: Never Used  . Alcohol Use: 0.0 oz/week    0 Standard drinks or equivalent per week    Review of Systems  Constitutional: Positive for fatigue. Negative for fever, chills and unexpected weight change.  HENT: Negative.   Eyes: Negative.   Respiratory: Negative.   Cardiovascular: Negative.   Gastrointestinal: Negative.   Endocrine: Negative.   Genitourinary: Negative.   Musculoskeletal: Positive for joint swelling and arthralgias.  Skin: Negative.   Allergic/Immunologic: Negative.   Neurological: Negative.   Hematological: Bruises/bleeds easily.  Psychiatric/Behavioral: The patient is nervous/anxious.     Objective:       BP 122/80 mmHg  Pulse 76  Temp(Src) 98 F (36.7 C) (Oral)  Ht 5' 4.5" (1.638 m)  Wt 176 lb 6 oz (80.003 kg)  BMI 29.82 kg/m2  SpO2 96%  Physical Exam  Constitutional: She is oriented to person, place, and time. She appears well-developed and well-nourished. No distress.  HENT:  Head: Normocephalic and atraumatic.  Nose: Nose normal.  Mouth/Throat: Oropharynx is clear and moist. No oropharyngeal exudate.  Normal TM's bilaterally.   Eyes: Conjunctivae are normal. No scleral icterus.  Neck: Neck supple. No thyromegaly present.  Cardiovascular: Normal rate and regular rhythm.   No murmur heard. Pulmonary/Chest: Effort normal and breath sounds normal. She has no  wheezes. She has no rales.  Abdominal: Soft. She exhibits no distension. There is no tenderness. There is no rebound and no guarding.  Musculoskeletal: She exhibits no edema.  Lymphadenopathy:    She has no cervical adenopathy.  Neurological: She is alert and oriented to person, place, and time.  Skin: Skin is warm and dry. No rash noted.  Psychiatric: She has a normal mood and affect.  Vitals reviewed.   Assessment & Plan:   Problem List Items Addressed This Visit    History of osteoporosis    Dexa scan ordered.       Relevant Orders   DG Bone Density   HLD (hyperlipidemia)    Obtaining Lipid panel today. Will discuss statin therapy pending the results.       Elevated blood pressure    Stable on Diltiazem.       Rheumatoid arthritis    Followed by rheumatology. Stable at this time on prednisone. Advised patient to continue and to follow closely with rheumatology.      Relevant Medications   predniSONE (DELTASONE) 5 MG tablet   Preventative health care    Health maintenance updated in the chart. Pneumovax given today. Patient is not a candidate for Zostavax given immunosuppression. Obtaining labs today: CBC, CMP, lipid, A1c. Pap smear and mammogram up-to-date. Colonoscopy screening up-to-date.      History of colonic polyps    CMA [name] spoke with her gastroenterologist. Patient had EGD and colonoscopy in 2014. His regulation was to proceed with repeat colonoscopy in 2019 (5 years).       Other Visit Diagnoses    Long term current use of systemic steroids    -  Primary    Relevant Orders    CBC    Hemoglobin A1c    Comprehensive metabolic panel    Lipid Profile    DG Bone Density    Prediabetes        Relevant Orders    Hemoglobin A1c    Overweight (BMI 25.0-29.9)        Relevant Orders    Hemoglobin A1c    Comprehensive metabolic panel    Lipid Profile    Need for 23-polyvalent pneumococcal polysaccharide vaccine        Relevant Orders      Pneumococcal polysaccharide vaccine 23-valent greater than or equal to 2yo subcutaneous/IM (Completed)       Outpatient Encounter Prescriptions as of 03/16/2015  Medication Sig  . aspirin 81 MG tablet Take 81 mg by mouth daily.  . Biotin 2.5 MG TABS Take by mouth.  . cetirizine (ZYRTEC) 10 MG tablet Take 10 mg by mouth daily.  . Cholecalciferol (VITAMIN D-3 PO) Take by mouth daily.  . Chromium Picolinate 200 MCG CAPS Take by mouth.  . Cinnamon 500 MG capsule Take by mouth.  . diltiazem (CARDIZEM  CD) 180 MG 24 hr capsule Take by mouth.  . meloxicam (MOBIC) 15 MG tablet   . Omega-3 Fatty Acids (SM FISH OIL) 1000 MG CAPS Take by mouth.  Maureen Chatters 125 MG/ML SOSY   . predniSONE (DELTASONE) 5 MG tablet Take 5 mg by mouth once.  . RABEprazole (ACIPHEX) 20 MG tablet Take 20 mg by mouth daily.  Marland Kitchen ALPRAZolam (XANAX) 0.5 MG tablet Take 0.5 mg by mouth at bedtime as needed for anxiety.  . [DISCONTINUED] amoxicillin-clavulanate (AUGMENTIN) 875-125 MG per tablet Take 1 tablet by mouth 2 (two) times daily. May fill between June 5 and February 05 2015  . [DISCONTINUED] Cetirizine HCl 10 MG CAPS Take by mouth.  . [DISCONTINUED] chlorpheniramine-HYDROcodone (TUSSIONEX PENNKINETIC ER) 10-8 MG/5ML SUER Take 5 mLs by mouth every 12 (twelve) hours as needed for cough.  . [DISCONTINUED] fexofenadine-pseudoephedrine (ALLEGRA-D ALLERGY & CONGESTION) 180-240 MG per 24 hr tablet Take 1 tablet by mouth daily.  . [DISCONTINUED] predniSONE (STERAPRED UNI-PAK 21 TAB) 10 MG (21) TBPK tablet Sig 6 tablet day 1, 5 tablets day 2, 4 tablets day 3,,3tablets day 4, 2 tablets day 5, 1 tablet day 6 take all tablets orally  . [DISCONTINUED] predniSONE (STERAPRED UNI-PAK 48 TAB) 5 MG (48) TBPK tablet See admin instructions.   Follow-up: 6 months   Coral Spikes DO

## 2015-03-16 NOTE — Assessment & Plan Note (Signed)
[place] spoke with her gastroenterologist. Patient had EGD and colonoscopy in 2014. His regulation was to proceed with repeat colonoscopy in 2019 (5 years).

## 2015-03-16 NOTE — Assessment & Plan Note (Signed)
Dexa scan ordered. 

## 2015-03-16 NOTE — Assessment & Plan Note (Signed)
Followed by rheumatology. Stable at this time on prednisone. Advised patient to continue and to follow closely with rheumatology.

## 2015-03-16 NOTE — Assessment & Plan Note (Signed)
Health maintenance updated in the chart. Pneumovax given today. Patient is not a candidate for Zostavax given immunosuppression. Obtaining labs today: CBC, CMP, lipid, A1c. Pap smear and mammogram up-to-date. Colonoscopy screening up-to-date.

## 2015-03-16 NOTE — Assessment & Plan Note (Signed)
Stable on Diltiazem.

## 2015-03-18 ENCOUNTER — Encounter: Payer: Self-pay | Admitting: Family Medicine

## 2015-05-12 ENCOUNTER — Other Ambulatory Visit: Payer: Self-pay

## 2015-05-12 MED ORDER — DILTIAZEM HCL ER COATED BEADS 180 MG PO CP24: 180.0000 mg | ORAL_CAPSULE | Freq: Every day | ORAL | Status: DC

## 2015-05-12 NOTE — Telephone Encounter (Signed)
Please advise refill as you have not refilled this before?

## 2015-07-09 ENCOUNTER — Other Ambulatory Visit: Payer: Self-pay | Admitting: Family Medicine

## 2015-07-09 ENCOUNTER — Telehealth: Payer: Self-pay | Admitting: *Deleted

## 2015-07-09 MED ORDER — RABEPRAZOLE SODIUM 20 MG PO TBEC: 20.0000 mg | DELAYED_RELEASE_TABLET | Freq: Every day | ORAL | Status: DC

## 2015-07-09 NOTE — Telephone Encounter (Signed)
Please advise on refill of Aciphex. Historical provider. Patient still has refills of the Diltiazem.

## 2015-07-09 NOTE — Telephone Encounter (Signed)
Refilled

## 2015-07-09 NOTE — Telephone Encounter (Signed)
Patient has requested a medication refill for diltiazem, raberprazole.

## 2015-09-17 ENCOUNTER — Ambulatory Visit: Payer: 59 | Admitting: Family Medicine

## 2015-09-17 ENCOUNTER — Encounter: Payer: Self-pay | Admitting: Family Medicine

## 2015-09-24 ENCOUNTER — Telehealth: Payer: Self-pay | Admitting: Family Medicine

## 2015-09-24 NOTE — Telephone Encounter (Signed)
Pt stated that this is a better # [phone]

## 2015-09-24 NOTE — Telephone Encounter (Signed)
Pt called about seeing the rheumatoid provider yesterday and was told that her white blood cell count was 16 and that she needed to be seen. I suggested to the pt I could schedule her for 09/27/15, but pt is worried that it's something serious. Can pt wait til Monday? Or does she need to come in today? Pt states that the pt's results was supposed to have been faxed over to our office. Let me know if I need to schedule. Call pt @ [phone]. Thank you!

## 2015-09-24 NOTE — Telephone Encounter (Signed)
Dr. Lacinda Axon to speak with patient via telephone.

## 2015-09-29 ENCOUNTER — Ambulatory Visit: Payer: 59

## 2015-10-01 ENCOUNTER — Ambulatory Visit: Payer: 59 | Admitting: Family Medicine

## 2015-10-18 ENCOUNTER — Ambulatory Visit
Admission: EM | Admit: 2015-10-18 | Discharge: 2015-10-18 | Disposition: A | Discharge status: Home / Self Care | Payer: 59 | Attending: Family Medicine | Admitting: Family Medicine

## 2015-10-18 ENCOUNTER — Encounter: Payer: Self-pay | Admitting: *Deleted

## 2015-10-18 DIAGNOSIS — R05 Cough: Secondary | ICD-10-CM | POA: Diagnosis not present

## 2015-10-18 DIAGNOSIS — R059 Cough, unspecified: Secondary | ICD-10-CM

## 2015-10-18 LAB — RAPID INFLUENZA A&B ANTIGENS: Influenza B (ARMC): NEGATIVE

## 2015-10-18 LAB — RAPID STREP SCREEN (MED CTR MEBANE ONLY): Streptococcus, Group A Screen (Direct): NEGATIVE

## 2015-10-18 LAB — RAPID INFLUENZA A&B ANTIGENS (ARMC ONLY): INFLUENZA A (ARMC): NEGATIVE

## 2015-10-18 MED ORDER — AZITHROMYCIN 250 MG PO TABS: ORAL_TABLET | ORAL | Status: DC

## 2015-10-18 MED ORDER — GUAIFENESIN-CODEINE 100-10 MG/5ML PO SOLN
ORAL | Status: DC
Start: 2015-10-18 — End: 2016-07-14

## 2015-10-18 NOTE — ED Provider Notes (Signed)
CSN: [id number]     Arrival date & time 10/18/15  1442 History   None    Chief Complaint  Patient presents with  . Cough  . Sore Throat  . Nasal Congestion  . Pleurisy  . Chills   (Consider location/radiation/quality/duration/timing/severity/associated sxs/prior Treatment) Patient is a 60 y.o. female presenting with URI. The history is provided by the patient.  URI Presenting symptoms: congestion, cough, fatigue, fever, rhinorrhea and sore throat   Severity:  Moderate Onset quality:  Sudden Duration:  7 days Timing:  Constant Progression:  Worsening Chronicity:  New Relieved by:  Nothing Ineffective treatments:  OTC medications Associated symptoms: headaches   Associated symptoms: no myalgias, no sinus pain and no wheezing   Risk factors: diabetes mellitus and sick contacts   Risk factors: not elderly, no chronic cardiac disease, no chronic kidney disease, no immunosuppression, no recent illness and no recent travel  Chronic respiratory disease: questionable (former smoker for over 14 years)     Past Medical History  Diagnosis Date  . Diabetes mellitus without complication ([place]) 0000000    boarderline  . Hernia 2007    hiatal  . Hypertension 2007  . Heart murmur 2010  . Colitis 2007    ulcerative  . Lump or mass in breast   . Bulging discs 2012  . Pap smear abnormality of cervix with LGSIL 07/18/2012    BVD  . Articular cartilage disorder     bulging & herniated disc in lower back  . Breast tenderness   . Arthritis 2014    rheumatoid  . Esophageal reflux   . Abnormal cervical Pap smear with positive HPV DNA test 02/01/12  . Benign essential hypertension   . Cancer [facility])    Past Surgical History  Procedure Laterality Date  . Polypectomy  2007    one  . Colonoscopy  2010  . Upper gi endoscopy  2010  . Cesarean section  1981  . Back surgery  12/27/2012    [facility]  . Cesarean section  1981    [facility]  . Colposcopy    . Cholecystectomy  2007   [facility]  . Abdominal hysterectomy  1986    [name]   Family History  Problem Relation Age of Onset  . Lung cancer Maternal Grandmother     Event organiser  . Ovarian cancer Maternal Grandmother     Event organiser  . Leukemia Paternal Grandmother   . Melanoma Mother   . Hypertension Mother   . Ovarian cancer Maternal Aunt   . Heart disease Maternal Grandfather    Social History  Substance Use Topics  . Smoking status: Former Smoker -- 0.70 packs/day for 20 years    Types: Cigarettes    Quit date: 10/24/2008  . Smokeless tobacco: Never Used  . Alcohol Use: 0.0 oz/week    0 Standard drinks or equivalent per week   OB History    No data available     Review of Systems  Constitutional: Positive for fever and fatigue.  HENT: Positive for congestion, rhinorrhea and sore throat.   Respiratory: Positive for cough. Negative for wheezing.   Musculoskeletal: Negative for myalgias.  Neurological: Positive for headaches.    Allergies  Hepatitis b vaccine; Metoclopramide; Lyrica; and Tramadol  Home Medications   Prior to Admission medications   Medication Sig Start Date End Date Taking? Authorizing Provider  aspirin 81 MG tablet Take 81 mg by mouth daily.   Yes Historical Provider, MD  cetirizine (ZYRTEC)  10 MG tablet Take 10 mg by mouth daily.   Yes Historical Provider, MD  Cholecalciferol (VITAMIN D-3 PO) Take by mouth daily.   Yes Historical Provider, MD  diltiazem (CARDIZEM CD) 180 MG 24 hr capsule Take 1 capsule (180 mg total) by mouth daily. 05/12/15  Yes Coral Spikes, DO  Omega-3 Fatty Acids (SM FISH OIL) 1000 MG CAPS Take by mouth.   Yes Historical Provider, MD  RABEprazole (ACIPHEX) 20 MG tablet Take 1 tablet (20 mg total) by mouth daily. 07/09/15  Yes Coral Spikes, DO  ALPRAZolam (XANAX) 0.5 MG tablet Take 0.5 mg by mouth at bedtime as needed for anxiety.    Historical Provider, MD  azithromycin (ZITHROMAX Z-PAK) 250 MG tablet 2 tabs po once day 1, then 1 tab po qd  for next 4 days 10/18/15   Norval Gable, MD  Biotin 2.5 MG TABS Take by mouth.    Historical Provider, MD  chlorpheniramine-HYDROcodone (TUSSIONEX PENNKINETIC ER) 10-8 MG/5ML SUER Take 5 mLs by mouth every 12 (twelve) hours as needed for cough. 10/19/15   Frederich Cha, MD  Chromium Picolinate 200 MCG CAPS Take by mouth.    Historical Provider, MD  Cinnamon 500 MG capsule Take by mouth.    Historical Provider, MD  guaiFENesin-codeine 100-10 MG/5ML syrup 1-2 teaspoons po qhs prn 10/18/15   Norval Gable, MD  meloxicam [facility]) 15 MG tablet  08/06/14   Historical Provider, MD  ORENCIA 125 MG/ML SOSY  12/24/14   Historical Provider, MD  predniSONE (DELTASONE) 5 MG tablet Take 5 mg by mouth once. 02/16/15   Historical Provider, MD   Meds Ordered and Administered this Visit  Medications - No data to display  BP 153/78 mmHg  Pulse 55  Temp(Src) 97.7 F (36.5 C) (Oral)  Resp 16  Ht 5\' 4"  (1.626 m)  Wt 176 lb (79.833 kg)  BMI 30.20 kg/m2  SpO2 99% No data found.   Physical Exam  Constitutional: She appears well-developed and well-nourished. No distress.  HENT:  Head: Normocephalic and atraumatic.  Right Ear: Tympanic membrane, external ear and ear canal normal.  Left Ear: Tympanic membrane, external ear and ear canal normal.  Nose: Rhinorrhea present. No nose lacerations, sinus tenderness, nasal deformity, septal deviation or nasal septal hematoma. No epistaxis.  No foreign bodies.  Mouth/Throat: Uvula is midline and mucous membranes are normal. Posterior oropharyngeal erythema present. No oropharyngeal exudate.  Eyes: Conjunctivae and EOM are normal. Pupils are equal, round, and reactive to light. Right eye exhibits no discharge. Left eye exhibits no discharge. No scleral icterus.  Neck: Normal range of motion. Neck supple. No thyromegaly present.  Cardiovascular: Normal rate, regular rhythm and normal heart sounds.   Pulmonary/Chest: Effort normal. No respiratory distress. She has no wheezes.  She has no rales.  Diffuse rhonchi  Lymphadenopathy:    She has no cervical adenopathy.  Skin: She is not diaphoretic.  Nursing note and vitals reviewed.   ED Course  Procedures (including critical care time)  Labs Review Labs Reviewed  RAPID INFLUENZA A&B ANTIGENS ([facility] ONLY)  RAPID STREP SCREEN (NOT AT [facility])  CULTURE, GROUP A STREP [facility])    Imaging Review No results found.   Visual Acuity Review  Right Eye Distance:   Left Eye Distance:   Bilateral Distance:    Right Eye Near:   Left Eye Near:    Bilateral Near:         MDM   1. Cough     Discharge Medication  List as of 10/18/2015  4:59 PM    START taking these medications   Details  azithromycin (ZITHROMAX Z-PAK) 250 MG tablet 2 tabs po once day 1, then 1 tab po qd for next 4 days, Normal    guaiFENesin-codeine 100-10 MG/5ML syrup 1-2 teaspoons po qhs prn, Print       1. Lab results and diagnosis reviewed with patient 2. rx as per orders above; reviewed possible side effects, interactions, risks and benefits  3. Recommend supportive treatment with increased fluids, rest, otc analgesics prn 4. Follow-up prn if symptoms worsen or don't improve   Norval Gable, MD 11/03/15 1315

## 2015-10-18 NOTE — ED Notes (Signed)
Productive cough- yellow, sore throat, runny nose, chest soreness & pain with coughing, chills, onset Saturday.

## 2015-10-19 MED ORDER — HYDROCOD POLST-CPM POLST ER 10-8 MG/5ML PO SUER: 5.0000 mL | Freq: Two times a day (BID) | ORAL | Status: DC | PRN

## 2015-10-20 LAB — CULTURE, GROUP A STREP (THRC)

## 2016-01-06 ENCOUNTER — Other Ambulatory Visit: Payer: Self-pay | Admitting: Family Medicine

## 2016-01-06 NOTE — Telephone Encounter (Signed)
Refilled 07/2015. Patient last seen 03/16/15. Please advise?

## 2016-04-03 ENCOUNTER — Other Ambulatory Visit: Payer: Self-pay | Admitting: Family Medicine

## 2016-04-03 NOTE — Telephone Encounter (Signed)
Last refilled 05/12/15 and last seen 03/16/2015. Please advise?

## 2016-07-01 ENCOUNTER — Other Ambulatory Visit: Payer: Self-pay | Admitting: Family Medicine

## 2016-07-03 ENCOUNTER — Other Ambulatory Visit: Payer: Self-pay | Admitting: Family Medicine

## 2016-07-14 ENCOUNTER — Ambulatory Visit (INDEPENDENT_AMBULATORY_CARE_PROVIDER_SITE_OTHER): Payer: 59 | Admitting: Family Medicine

## 2016-07-14 ENCOUNTER — Encounter: Payer: Self-pay | Admitting: Family Medicine

## 2016-07-14 VITALS — BP 145/76 | HR 79 | Temp 98.3°F | Resp 14 | Wt 197.5 lb

## 2016-07-14 DIAGNOSIS — Z1231 Encounter for screening mammogram for malignant neoplasm of breast: Secondary | ICD-10-CM | POA: Diagnosis not present

## 2016-07-14 DIAGNOSIS — R7303 Prediabetes: Secondary | ICD-10-CM

## 2016-07-14 DIAGNOSIS — Z7952 Long term (current) use of systemic steroids: Secondary | ICD-10-CM

## 2016-07-14 DIAGNOSIS — Z23 Encounter for immunization: Secondary | ICD-10-CM

## 2016-07-14 DIAGNOSIS — E785 Hyperlipidemia, unspecified: Secondary | ICD-10-CM | POA: Diagnosis not present

## 2016-07-14 DIAGNOSIS — I10 Essential (primary) hypertension: Secondary | ICD-10-CM

## 2016-07-14 DIAGNOSIS — M069 Rheumatoid arthritis, unspecified: Secondary | ICD-10-CM

## 2016-07-14 DIAGNOSIS — Z1239 Encounter for other screening for malignant neoplasm of breast: Secondary | ICD-10-CM

## 2016-07-14 LAB — LIPID PANEL
CHOL/HDL RATIO: 7
Cholesterol: 282 mg/dL — ABNORMAL HIGH (ref 0–200)
HDL: 41.5 mg/dL (ref >39.00)

## 2016-07-14 LAB — LDL CHOLESTEROL, DIRECT: LDL DIRECT: 141 mg/dL

## 2016-07-14 LAB — HEMOGLOBIN A1C: Hgb A1c MFr Bld: 6.3 % (ref 4.6–6.5)

## 2016-07-14 MED ORDER — ROSUVASTATIN CALCIUM 10 MG PO TABS: 10.0000 mg | ORAL_TABLET | Freq: Every day | ORAL | 3 refills | Status: DC

## 2016-07-14 NOTE — Patient Instructions (Signed)
Start the crestor.  Keep an eye on your BP.  Follow up in 3-6 months.  Take care  Dr. Lacinda Axon

## 2016-07-14 NOTE — Progress Notes (Signed)
Pre visit review using our clinic review tool, if applicable. No additional management support is needed unless otherwise documented below in the visit note. 

## 2016-07-15 NOTE — Assessment & Plan Note (Signed)
Established problem, worsening. Starting Crestor.

## 2016-07-15 NOTE — Progress Notes (Signed)
Subjective:  Patient ID: Breanna Farrell, female    DOB: January 19, 1956  Age: 60 y.o. MRN: [id number]  CC: Follow up  HPI:  60 year old female with RA, HLD, HTN presents for follow up.  Hypertension  Stable on Diltiazem.  BP elevated today.  Will discuss.  Hyperlipidemia  Uncontrolled and untreated.  Will discuss treatment today.  RA  Stable currently.  Is now on Humira and doing well.  Rheumatology tapering prednisone.   Social Hx   Social History   Social History  . Marital status: Married    Spouse name: N/A  . Number of children: N/A  . Years of education: N/A   Social History Main Topics  . Smoking status: Former Smoker    Packs/day: 0.70    Years: 20.00    Types: Cigarettes    Quit date: 10/24/2008  . Smokeless tobacco: Never Used  . Alcohol use 0.0 oz/week  . Drug use: No  . Sexual activity: Not Asked   Other Topics Concern  . None   Social History Narrative  . None    Review of Systems  Constitutional: Negative.   Musculoskeletal: Positive for arthralgias.   Objective:  BP (!) 145/76 (BP Location: Left Arm, Patient Position: Sitting, Cuff Size: Normal)   Pulse 79   Temp 98.3 F (36.8 C) (Oral)   Resp 14   Wt 197 lb 8 oz (89.6 kg)   SpO2 95%   BMI 33.90 kg/m   BP/Weight 07/14/2016 0000000 [id number]  Systolic BP [id number] 0000000 [id number]  Diastolic BP 76 78 80  Wt. (Lbs) 197.5 176 176.38  BMI 33.9 30.2 29.82    Physical Exam  Constitutional: She is oriented to person, place, and time. She appears well-developed. No distress.  Cardiovascular: Normal rate and regular rhythm.   Pulmonary/Chest: Effort normal. She has no wheezes. She has no rales.  Neurological: She is alert and oriented to person, place, and time.  Psychiatric: She has a normal mood and affect.  Vitals reviewed.  Lab Results  Component Value Date   WBC 11.8 (H) 03/16/2015   HGB 14.9 03/16/2015   HCT 44.4 03/16/2015   PLT 288.0 03/16/2015   GLUCOSE 93 03/16/2015   CHOL  282 (H) 07/14/2016   TRIG (H) 07/14/2016    558.0 Triglyceride is over 400; calculations on Lipids are invalid.   HDL 41.50 07/14/2016   LDLDIRECT 141.0 07/14/2016   ALT 21 03/16/2015   AST 24 03/16/2015   NA 139 03/16/2015   K 3.9 03/16/2015   CL 104 03/16/2015   CREATININE 0.86 03/16/2015   BUN 15 03/16/2015   CO2 27 03/16/2015   HGBA1C 6.3 07/14/2016    Assessment & Plan:   Problem List Items Addressed This Visit    Rheumatoid arthritis ([place])    Stable. Continue humira and tapering of prednisone.       Relevant Medications   Adalimumab (HUMIRA) 40 MG/0.8ML PSKT   aspirin 81 MG tablet   Hyperlipidemia    Established problem, worsening. Starting Crestor.      Relevant Medications   aspirin 81 MG tablet   rosuvastatin (CRESTOR) 10 MG tablet   Other Relevant Orders   Lipid panel (Completed)   LDL cholesterol, direct (Completed)   Essential hypertension - Primary    Established problem, stable. BP elevated initially but improved on repeat (below 130/80). Continue Diltiazem. Monitor pressures closely.       Relevant Medications   aspirin 81 MG tablet   rosuvastatin (  CRESTOR) 10 MG tablet    Other Visit Diagnoses    Prediabetes       Relevant Orders   Hemoglobin A1c (Completed)   Screening for breast cancer       Relevant Orders   MM DIGITAL SCREENING BILATERAL   Current chronic use of systemic steroids       Relevant Orders   DG BONE DENSITY (DXA)   Encounter for immunization       Relevant Orders   Flu Vaccine QUAD 36+ mos IM (Completed)     Meds ordered this encounter  Medications  . DISCONTD: Aspirin-Calcium Carbonate 81-777 MG TABS    Sig: Take by mouth.  . Adalimumab (HUMIRA) 40 MG/0.8ML PSKT    Sig: Inject into the skin. Take every two weeks sub Q.  . aspirin 81 MG tablet    Sig: Take 81 mg by mouth daily.  . rosuvastatin (CRESTOR) 10 MG tablet    Sig: Take 1 tablet (10 mg total) by mouth daily.    Dispense:  90 tablet    Refill:  3     Follow-up: 3-6 months  [place]

## 2016-07-15 NOTE — Assessment & Plan Note (Signed)
Established problem, stable. BP elevated initially but improved on repeat (below 130/80). Continue Diltiazem. Monitor pressures closely.

## 2016-07-15 NOTE — Assessment & Plan Note (Signed)
Stable. Continue humira and tapering of prednisone.

## 2016-07-28 ENCOUNTER — Other Ambulatory Visit: Payer: Self-pay | Admitting: Family Medicine

## 2016-07-30 ENCOUNTER — Other Ambulatory Visit: Payer: Self-pay | Admitting: Family Medicine

## 2016-08-02 ENCOUNTER — Encounter: Payer: Self-pay | Admitting: Family Medicine

## 2016-08-02 ENCOUNTER — Ambulatory Visit (INDEPENDENT_AMBULATORY_CARE_PROVIDER_SITE_OTHER): Payer: 59 | Admitting: Family Medicine

## 2016-08-02 DIAGNOSIS — J209 Acute bronchitis, unspecified: Secondary | ICD-10-CM | POA: Diagnosis not present

## 2016-08-02 DIAGNOSIS — J40 Bronchitis, not specified as acute or chronic: Secondary | ICD-10-CM | POA: Insufficient documentation

## 2016-08-02 MED ORDER — HYDROCOD POLST-CPM POLST ER 10-8 MG/5ML PO SUER: 5.0000 mL | Freq: Two times a day (BID) | ORAL | 0 refills | Status: DC | PRN

## 2016-08-02 MED ORDER — DOXYCYCLINE HYCLATE 100 MG PO TABS: 100.0000 mg | ORAL_TABLET | Freq: Two times a day (BID) | ORAL | 0 refills | Status: DC

## 2016-08-02 NOTE — Progress Notes (Signed)
Pre visit review using our clinic review tool, if applicable. No additional management support is needed unless otherwise documented below in the visit note. 

## 2016-08-02 NOTE — Assessment & Plan Note (Signed)
New acute problem. Given immunosuppression from chronic prednisone and humira, treating with Antibiotic (Doxy). Tussionex for cough.

## 2016-08-02 NOTE — Progress Notes (Signed)
Subjective:  Patient ID: Breanna Farrell, female    DOB: 10/09/1955  Age: 60 y.o. MRN: [id number]  CC: Cough, congestion  HPI:  60 year old female with rheumatoid arthritis, hypertension, hyperlipidemia presents with the above complaints.  Patient states that she's been sick for the past week. Initially started with sinus pressure and drainage. Has now progressed to severe productive cough. She's been using over-the-counter medications including Mucinex without improving. No associated fever. She does report some associated chest tightness. No known exacerbating factors. No other associated symptoms. No other complaints or concerns at this time.  Social Hx   Social History   Social History  . Marital status: Married    Spouse name: N/A  . Number of children: N/A  . Years of education: N/A   Social History Main Topics  . Smoking status: Former Smoker    Packs/day: 0.70    Years: 20.00    Types: Cigarettes    Quit date: 10/24/2008  . Smokeless tobacco: Never Used  . Alcohol use 0.0 oz/week  . Drug use: No  . Sexual activity: Not Asked   Other Topics Concern  . None   Social History Narrative  . None   Review of Systems  Constitutional: Negative for fever.  HENT: Positive for postnasal drip.   Respiratory: Positive for cough and chest tightness.    Objective:  BP 132/77   Pulse 79   Temp 97.9 F (36.6 C) (Oral)   Resp 14   Wt 195 lb 4 oz (88.6 kg)   SpO2 96%   BMI 33.51 kg/m   BP/Weight 08/02/2016 07/14/2016 0000000  Systolic BP Q000111Q Q000111Q 0000000  Diastolic BP 77 76 78  Wt. (Lbs) 195.25 197.5 176  BMI 33.51 33.9 30.2   Physical Exam  Constitutional: She is oriented to person, place, and time. She appears well-developed. No distress.  HENT:  Mouth/Throat: Oropharynx is clear and moist.  Cardiovascular: Normal rate and regular rhythm.   Pulmonary/Chest: Effort normal and breath sounds normal. She has no wheezes. She has no rales.  Neurological: She is alert and  oriented to person, place, and time.  Psychiatric: She has a normal mood and affect.  Vitals reviewed.  Lab Results  Component Value Date   WBC 11.8 (H) 03/16/2015   HGB 14.9 03/16/2015   HCT 44.4 03/16/2015   PLT 288.0 03/16/2015   GLUCOSE 93 03/16/2015   CHOL 282 (H) 07/14/2016   TRIG (H) 07/14/2016    558.0 Triglyceride is over 400; calculations on Lipids are invalid.   HDL 41.50 07/14/2016   LDLDIRECT 141.0 07/14/2016   ALT 21 03/16/2015   AST 24 03/16/2015   NA 139 03/16/2015   K 3.9 03/16/2015   CL 104 03/16/2015   CREATININE 0.86 03/16/2015   BUN 15 03/16/2015   CO2 27 03/16/2015   HGBA1C 6.3 07/14/2016   Assessment & Plan:   Problem List Items Addressed This Visit    Acute bronchitis    New acute problem. Given immunosuppression from chronic prednisone and humira, treating with Antibiotic (Doxy). Tussionex for cough.          Meds ordered this encounter  Medications  . doxycycline (VIBRA-TABS) 100 MG tablet    Sig: Take 1 tablet (100 mg total) by mouth 2 (two) times daily.    Dispense:  14 tablet    Refill:  0  . chlorpheniramine-HYDROcodone (TUSSIONEX PENNKINETIC ER) 10-8 MG/5ML SUER    Sig: Take 5 mLs by mouth every 12 (twelve) hours  as needed.    Dispense:  115 mL    Refill:  0    Follow-up: PRN  [place]

## 2016-08-02 NOTE — Patient Instructions (Signed)
Antibiotic as prescribed.  If you want to increase the prednisone a little it may help.  Take care  Dr. Lacinda Axon    Acute Bronchitis, Adult Acute bronchitis is sudden (acute) swelling of the air tubes (bronchi) in the lungs. Acute bronchitis causes these tubes to fill with mucus, which can make it hard to breathe. It can also cause coughing or wheezing. In adults, acute bronchitis usually goes away within 2 weeks. A cough caused by bronchitis may last up to 3 weeks. Smoking, allergies, and asthma can make the condition worse. Repeated episodes of bronchitis may cause further lung problems, such as chronic obstructive pulmonary disease (COPD). What are the causes? This condition can be caused by germs and by substances that irritate the lungs, including:  Cold and flu viruses. This condition is most often caused by the same virus that causes a cold.  Bacteria.  Exposure to tobacco smoke, dust, fumes, and air pollution. What increases the risk? This condition is more likely to develop in people who:  Have close contact with someone with acute bronchitis.  Are exposed to lung irritants, such as tobacco smoke, dust, fumes, and vapors.  Have a weak immune system.  Have a respiratory condition such as asthma. What are the signs or symptoms? Symptoms of this condition include:  A cough.  Coughing up clear, yellow, or green mucus.  Wheezing.  Chest congestion.  Shortness of breath.  A fever.  Body aches.  Chills.  A sore throat. How is this diagnosed? This condition is usually diagnosed with a physical exam. During the exam, your health care provider may order tests, such as chest X-rays, to rule out other conditions. He or she may also:  Test a sample of your mucus for bacterial infection.  Check the level of oxygen in your blood. This is done to check for pneumonia.  Do a chest X-ray or lung function testing to rule out pneumonia and other conditions.  Perform blood  tests. Your health care provider will also ask about your symptoms and medical history. How is this treated? Most cases of acute bronchitis clear up over time without treatment. Your health care provider may recommend:  Drinking more fluids. Drinking more makes your mucus thinner, which may make it easier to breathe.  Taking a medicine for a fever or cough.  Taking an antibiotic medicine.  Using an inhaler to help improve shortness of breath and to control a cough.  Using a cool mist vaporizer or humidifier to make it easier to breathe. Follow these instructions at home: Medicines  Take over-the-counter and prescription medicines only as told by your health care provider.  If you were prescribed an antibiotic, take it as told by your health care provider. Do not stop taking the antibiotic even if you start to feel better. General instructions  Get plenty of rest.  Drink enough fluids to keep your urine clear or pale yellow.  Avoid smoking and secondhand smoke. Exposure to cigarette smoke or irritating chemicals will make bronchitis worse. If you smoke and you need help quitting, ask your health care provider. Quitting smoking will help your lungs heal faster.  Use an inhaler, cool mist vaporizer, or humidifier as told by your health care provider.  Keep all follow-up visits as told by your health care provider. This is important. How is this prevented? To lower your risk of getting this condition again:  Wash your hands often with soap and water. If soap and water are not available, use  hand sanitizer.  Avoid contact with people who have cold symptoms.  Try not to touch your hands to your mouth, nose, or eyes.  Make sure to get the flu shot every year. Contact a health care provider if:  Your symptoms do not improve in 2 weeks of treatment. Get help right away if:  You cough up blood.  You have chest pain.  You have severe shortness of breath.  You become  dehydrated.  You faint or keep feeling like you are going to faint.  You keep vomiting.  You have a severe headache.  Your fever or chills gets worse. This information is not intended to replace advice given to you by your health care provider. Make sure you discuss any questions you have with your health care provider. Document Released: 08/31/2004 Document Revised: 02/16/2016 Document Reviewed: 01/12/2016 Elsevier Interactive Patient Education  2017 [employer].

## 2016-08-25 ENCOUNTER — Other Ambulatory Visit: Payer: Self-pay | Admitting: Family Medicine

## 2016-10-19 ENCOUNTER — Encounter: Payer: Self-pay | Admitting: Family Medicine

## 2016-10-19 ENCOUNTER — Ambulatory Visit (INDEPENDENT_AMBULATORY_CARE_PROVIDER_SITE_OTHER): Payer: 59 | Admitting: Family Medicine

## 2016-10-19 DIAGNOSIS — J988 Other specified respiratory disorders: Secondary | ICD-10-CM | POA: Diagnosis not present

## 2016-10-19 MED ORDER — AMOXICILLIN-POT CLAVULANATE 875-125 MG PO TABS: 1.0000 | ORAL_TABLET | Freq: Two times a day (BID) | ORAL | 0 refills | Status: DC

## 2016-10-19 MED ORDER — HYDROCOD POLST-CPM POLST ER 10-8 MG/5ML PO SUER: 5.0000 mL | Freq: Two times a day (BID) | ORAL | 0 refills | Status: DC | PRN

## 2016-10-19 NOTE — Progress Notes (Signed)
Subjective:  Patient ID: Breanna Farrell, female    DOB: 08/26/1955  Age: 61 y.o. MRN: [id number]  CC: URI symptoms   HPI:  61 year old female with RA, HTN, HLD presents with the above complaints.  Patient reports that she has been sick since Monday. She has had severe sinus pressure, congestion and associated headache. She reports clear nasal drainage and cough as well. Cough is slightly productive. She has been using Delsym without symptom improvement. No associated fever. No chills. No other associated symptoms. No other complaint of concerns this time.  Social Hx   Social History   Social History  . Marital status: Married    Spouse name: N/A  . Number of children: N/A  . Years of education: N/A   Social History Main Topics  . Smoking status: Former Smoker    Packs/day: 0.70    Years: 20.00    Types: Cigarettes    Quit date: 10/24/2008  . Smokeless tobacco: Never Used  . Alcohol use 0.0 oz/week  . Drug use: No  . Sexual activity: Not Asked   Other Topics Concern  . None   Social History Narrative  . None    Review of Systems  Constitutional: Negative for fever.  HENT: Positive for congestion, rhinorrhea, sinus pain and sinus pressure.   Respiratory: Positive for cough.    Objective:  BP (!) 152/100   Pulse 93   Temp 98.6 F (37 C) (Oral)   Wt 203 lb 4 oz (92.2 kg)   SpO2 93%   BMI 34.89 kg/m   BP/Weight 10/19/2016 08/02/2016 14/11/8183  Systolic BP 631 497 026  Diastolic BP 378 77 76  Wt. (Lbs) 203.25 195.25 197.5  BMI 34.89 33.51 33.9   Physical Exam  Constitutional: She is oriented to person, place, and time. She appears well-developed. No distress.  HENT:  Head: Normocephalic and atraumatic.  Mouth/Throat: Oropharynx is clear and moist.  Maxillary sinus tenderness to palpation.  Neck: Neck supple.  Cardiovascular: Normal rate and regular rhythm.   Pulmonary/Chest: Effort normal and breath sounds normal.  Neurological: She is alert and oriented  to person, place, and time.  Psychiatric: She has a normal mood and affect.  Vitals reviewed.   Lab Results  Component Value Date   WBC 11.8 (H) 03/16/2015   HGB 14.9 03/16/2015   HCT 44.4 03/16/2015   PLT 288.0 03/16/2015   GLUCOSE 93 03/16/2015   CHOL 282 (H) 07/14/2016   TRIG (H) 07/14/2016    558.0 Triglyceride is over 400; calculations on Lipids are invalid.   HDL 41.50 07/14/2016   LDLDIRECT 141.0 07/14/2016   ALT 21 03/16/2015   AST 24 03/16/2015   NA 139 03/16/2015   K 3.9 03/16/2015   CL 104 03/16/2015   CREATININE 0.86 03/16/2015   BUN 15 03/16/2015   CO2 27 03/16/2015   HGBA1C 6.3 07/14/2016    Assessment & Plan:   Problem List Items Addressed This Visit    Respiratory infection    New problem. Given immunosuppression from chronic steroid use, I'm covering her with Augmentin. Tussionex for cough.         Meds ordered this encounter  Medications  . amoxicillin-clavulanate (AUGMENTIN) 875-125 MG tablet    Sig: Take 1 tablet by mouth 2 (two) times daily.    Dispense:  14 tablet    Refill:  0  . chlorpheniramine-HYDROcodone (TUSSIONEX PENNKINETIC ER) 10-8 MG/5ML SUER    Sig: Take 5 mLs by mouth every  12 (twelve) hours as needed.    Dispense:  115 mL    Refill:  0    Follow-up: PRN  [place]

## 2016-10-19 NOTE — Patient Instructions (Signed)
Medications as prescribed. ° °Take care ° °Dr. Zaydyn Havey  °

## 2016-10-19 NOTE — Progress Notes (Signed)
Pre visit review using our clinic review tool, if applicable. No additional management support is needed unless otherwise documented below in the visit note. 

## 2016-10-19 NOTE — Assessment & Plan Note (Signed)
New problem. Given immunosuppression from chronic steroid use, I'm covering her with Augmentin. Tussionex for cough.

## 2016-11-07 ENCOUNTER — Ambulatory Visit (INDEPENDENT_AMBULATORY_CARE_PROVIDER_SITE_OTHER): Payer: 59 | Admitting: Family Medicine

## 2016-11-07 ENCOUNTER — Encounter: Payer: Self-pay | Admitting: Family Medicine

## 2016-11-07 ENCOUNTER — Other Ambulatory Visit: Payer: Self-pay | Admitting: Family Medicine

## 2016-11-07 DIAGNOSIS — I10 Essential (primary) hypertension: Secondary | ICD-10-CM | POA: Diagnosis not present

## 2016-11-07 DIAGNOSIS — K219 Gastro-esophageal reflux disease without esophagitis: Secondary | ICD-10-CM | POA: Diagnosis not present

## 2016-11-07 DIAGNOSIS — M069 Rheumatoid arthritis, unspecified: Secondary | ICD-10-CM

## 2016-11-07 DIAGNOSIS — E782 Mixed hyperlipidemia: Secondary | ICD-10-CM | POA: Diagnosis not present

## 2016-11-07 NOTE — Assessment & Plan Note (Signed)
Tapering prednisone. Advised to follow closely with Rheumatology.

## 2016-11-07 NOTE — Assessment & Plan Note (Signed)
Stable on Aciphex.

## 2016-11-07 NOTE — Assessment & Plan Note (Signed)
Uncontrolled. Advised to try Crestor twice weekly.

## 2016-11-07 NOTE — Progress Notes (Signed)
Pre visit review using our clinic review tool, if applicable. No additional management support is needed unless otherwise documented below in the visit note. 

## 2016-11-07 NOTE — Patient Instructions (Addendum)
Try the crestor 2x/week.  Continue your other medications.  Consider Shingrix.  Follow up in 6 months.  Take care  Dr. Lacinda Axon

## 2016-11-07 NOTE — Progress Notes (Signed)
   Subjective:  Patient ID: Breanna Farrell, female    DOB: Jan 26, 1956  Age: 61 y.o. MRN: [id number]  CC: Follow up  HPI:  61 year old female with hypertension, hyperlipidemia, RA, GERD presents for follow-up.  HTN  BP well controlled today.  She endorses compliance with Cardizem.  HLD  Uncontrolled.  Did not tolerate daily crestor.  Will discuss today.  RA  Remains on prednisone. Currently tapering down.  On Humira.  GERD  Stable on Aciphex.   Social Hx   Social History   Social History  . Marital status: Married    Spouse name: N/A  . Number of children: N/A  . Years of education: N/A   Social History Main Topics  . Smoking status: Former Smoker    Packs/day: 0.70    Years: 20.00    Types: Cigarettes    Quit date: 10/24/2008  . Smokeless tobacco: Never Used  . Alcohol use 0.0 oz/week  . Drug use: No  . Sexual activity: Not Asked   Other Topics Concern  . None   Social History Narrative  . None    Review of Systems  Constitutional: Negative.   Musculoskeletal: Positive for arthralgias.   Objective:  BP 130/82   Pulse 80   Temp 98 F (36.7 C) (Oral)   Resp 18   Ht 5\' 4"  (1.626 m)   Wt 202 lb 6 oz (91.8 kg)   SpO2 95%   BMI 34.74 kg/m   BP/Weight 11/07/2016 10/19/2016 53/97/6734  Systolic BP 193 790 240  Diastolic BP 82 973 77  Wt. (Lbs) 202.38 203.25 195.25  BMI 34.74 34.89 33.51    Physical Exam  Constitutional: She is oriented to person, place, and time. She appears well-developed. No distress.  Cardiovascular: Normal rate and regular rhythm.   Pulmonary/Chest: Effort normal and breath sounds normal.  Neurological: She is alert and oriented to person, place, and time.  Psychiatric: She has a normal mood and affect.  Vitals reviewed.   Lab Results  Component Value Date   WBC 11.8 (H) 03/16/2015   HGB 14.9 03/16/2015   HCT 44.4 03/16/2015   PLT 288.0 03/16/2015   GLUCOSE 93 03/16/2015   CHOL 282 (H) 07/14/2016   TRIG (H)  07/14/2016    558.0 Triglyceride is over 400; calculations on Lipids are invalid.   HDL 41.50 07/14/2016   LDLDIRECT 141.0 07/14/2016   ALT 21 03/16/2015   AST 24 03/16/2015   NA 139 03/16/2015   K 3.9 03/16/2015   CL 104 03/16/2015   CREATININE 0.86 03/16/2015   BUN 15 03/16/2015   CO2 27 03/16/2015   HGBA1C 6.3 07/14/2016    Assessment & Plan:   Problem List Items Addressed This Visit    Essential hypertension    Stable currently (improved). Continue Cardizem.      GERD (gastroesophageal reflux disease)    Stable on Aciphex.      Hyperlipidemia    Uncontrolled. Advised to try Crestor twice weekly.      Rheumatoid arthritis (HCC)    Tapering prednisone. Advised to follow closely with Rheumatology.         Follow-up: 6 months  [place] DO [facility]

## 2016-11-07 NOTE — Assessment & Plan Note (Signed)
Stable currently (improved). Continue Cardizem.

## 2016-11-26 ENCOUNTER — Other Ambulatory Visit: Payer: Self-pay | Admitting: Family Medicine

## 2016-12-20 ENCOUNTER — Ambulatory Visit: Payer: 59 | Admitting: Podiatry

## 2016-12-25 ENCOUNTER — Other Ambulatory Visit: Payer: Self-pay | Admitting: Family Medicine

## 2017-01-26 ENCOUNTER — Encounter: Payer: Self-pay | Admitting: Family Medicine

## 2017-01-26 ENCOUNTER — Ambulatory Visit (INDEPENDENT_AMBULATORY_CARE_PROVIDER_SITE_OTHER): Payer: 59 | Admitting: Family Medicine

## 2017-01-26 VITALS — BP 140/88 | HR 78 | Temp 98.6°F | Wt 204.0 lb

## 2017-01-26 DIAGNOSIS — F419 Anxiety disorder, unspecified: Secondary | ICD-10-CM | POA: Diagnosis not present

## 2017-01-26 DIAGNOSIS — E782 Mixed hyperlipidemia: Secondary | ICD-10-CM

## 2017-01-26 DIAGNOSIS — R600 Localized edema: Secondary | ICD-10-CM | POA: Insufficient documentation

## 2017-01-26 DIAGNOSIS — R5382 Chronic fatigue, unspecified: Secondary | ICD-10-CM

## 2017-01-26 DIAGNOSIS — Z130 Encounter for screening for diseases of the blood and blood-forming organs and certain disorders involving the immune mechanism: Secondary | ICD-10-CM | POA: Diagnosis not present

## 2017-01-26 DIAGNOSIS — F32A Depression, unspecified: Secondary | ICD-10-CM | POA: Insufficient documentation

## 2017-01-26 DIAGNOSIS — R7303 Prediabetes: Secondary | ICD-10-CM | POA: Diagnosis not present

## 2017-01-26 DIAGNOSIS — F329 Major depressive disorder, single episode, unspecified: Secondary | ICD-10-CM | POA: Insufficient documentation

## 2017-01-26 LAB — COMPREHENSIVE METABOLIC PANEL
ALK PHOS: 55 U/L (ref 39–117)
ALT: 27 U/L (ref 0–35)
AST: 24 U/L (ref 0–37)
Albumin: 4.4 g/dL (ref 3.5–5.2)
BILIRUBIN TOTAL: 0.4 mg/dL (ref 0.2–1.2)
BUN: 13 mg/dL (ref 6–23)
CO2: 29 mEq/L (ref 19–32)
CREATININE: 0.88 mg/dL (ref 0.40–1.20)
Calcium: 10 mg/dL (ref 8.4–10.5)
Chloride: 103 mEq/L (ref 96–112)
GFR: 68.91 mL/min (ref >60.00)
GLUCOSE: 98 mg/dL (ref 70–99)
Potassium: 4.2 mEq/L (ref 3.5–5.1)
Sodium: 140 mEq/L (ref 135–145)
TOTAL PROTEIN: 7.2 g/dL (ref 6.0–8.3)

## 2017-01-26 LAB — TSH: TSH: 1.32 u[IU]/mL (ref 0.35–4.50)

## 2017-01-26 LAB — HEMOGLOBIN A1C: HEMOGLOBIN A1C: 6.6 % — AB (ref 4.6–6.5)

## 2017-01-26 LAB — LIPID PANEL
Cholesterol: 303 mg/dL — ABNORMAL HIGH (ref 0–200)
HDL: 44.5 mg/dL (ref >39.00)
Total CHOL/HDL Ratio: 7
Triglycerides: 639 mg/dL — ABNORMAL HIGH (ref 0.0–149.0)

## 2017-01-26 LAB — CBC
HEMATOCRIT: 42.8 % (ref 36.0–46.0)
HEMOGLOBIN: 14.5 g/dL (ref 12.0–15.0)
MCHC: 33.9 g/dL (ref 30.0–36.0)
MCV: 90.6 fl (ref 78.0–100.0)
Platelets: 275 10*3/uL (ref 150.0–400.0)
RBC: 4.72 Mil/uL (ref 3.87–5.11)
RDW: 13.7 % (ref 11.5–15.5)
WBC: 11 10*3/uL — AB (ref 4.0–10.5)

## 2017-01-26 LAB — LDL CHOLESTEROL, DIRECT: Direct LDL: 92 mg/dL

## 2017-01-26 LAB — BRAIN NATRIURETIC PEPTIDE: Pro B Natriuretic peptide (BNP): 42 pg/mL (ref 0.0–100.0)

## 2017-01-26 MED ORDER — SERTRALINE HCL 50 MG PO TABS: 50.0000 mg | ORAL_TABLET | Freq: Every day | ORAL | 1 refills | Status: DC

## 2017-01-26 MED ORDER — LOSARTAN POTASSIUM 50 MG PO TABS: 50.0000 mg | ORAL_TABLET | Freq: Every day | ORAL | 3 refills | Status: DC

## 2017-01-26 NOTE — Patient Instructions (Signed)
We will call with the lab results.  Stop Dilt. Start losartan. Lab in 7-10 days.  Zoloft for depression/anxiety. Follow up in 6 weeks.  Take care  Dr. Lacinda Axon

## 2017-01-26 NOTE — Progress Notes (Signed)
Pre-visit discussion using our clinic review tool. No additional management support is needed unless otherwise documented below in the visit note.  

## 2017-01-26 NOTE — Assessment & Plan Note (Signed)
New problem. Likely multifactorial in nature but I suspect that diltiazem is playing a significant role. Stopping diltiazem. Starting losartan for hypertension. Metabolic panel in 6-44 days.

## 2017-01-26 NOTE — Assessment & Plan Note (Signed)
New problem. Starting on Zoloft. Referring to psychology for therapy. Follow up in 6 weeks.

## 2017-01-26 NOTE — Progress Notes (Signed)
Subjective:  Patient ID: Breanna Farrell, female    DOB: [birth year]  Age: 61 y.o. MRN: [id number]  CC: Lower extremity edema, anxiety  HPI:  61 year old female with hypertension, RA, GERD, history of osteoporosis, hyperlipidemia presents with the above complaints.  Patient reports that she has had lower extremity edema for months. Has been worsening as of late. Causes pain. Worse at the end of the day. Does improve with elevation and rest. She does have some varicosity. No reports of shortness of breath. Moderate in severity.  Additionally, patient states that she just doesn't feel well. She feels anxious and overwhelmed. She's not sleeping well. She has ongoing stressors in her life. She states that she is quite tearful often. She is overwhelmed particularly with dealing with rheumatoid arthritis. She states that her RA is improving on the Simponi.   Blood pressure elevated today.  Social Hx   Social History   Social History  . Marital status: Married    Spouse name: N/A  . Number of children: N/A  . Years of education: N/A   Social History Main Topics  . Smoking status: Former Smoker    Packs/day: 0.70    Years: 20.00    Types: Cigarettes    Quit date: 10/24/2008  . Smokeless tobacco: Never Used  . Alcohol use 0.0 oz/week  . Drug use: No  . Sexual activity: Not Asked   Other Topics Concern  . None   Social History Narrative  . None    Review of Systems  Constitutional: Positive for fatigue.  Cardiovascular: Positive for leg swelling.  Musculoskeletal: Positive for arthralgias.   Objective:  BP 140/88 (BP Location: Left Arm, Cuff Size: Normal)   Pulse 78   Temp 98.6 F (37 C) (Oral)   Wt 204 lb (92.5 kg)   BMI 35.02 kg/m   BP/Weight 01/26/2017 11/07/2016 8/67/6720  Systolic BP 947 096 283  Diastolic BP 88 82 662  Wt. (Lbs) 204 202.38 203.25  BMI 35.02 34.74 34.89    Physical Exam  Constitutional: She is oriented to person, place, and time. She appears  well-developed. No distress.  Cardiovascular: Normal rate and regular rhythm.   2+ pitting LE edema bilaterally.   Pulmonary/Chest: Effort normal and breath sounds normal.  Neurological: She is alert and oriented to person, place, and time.  Psychiatric:  Tearful. Flat affect.  Vitals reviewed.  Assessment & Plan:   Problem List Items Addressed This Visit      Other   Anxiety and depression    New problem. Starting on Zoloft. Referring to psychology for therapy. Follow up in 6 weeks.      Relevant Orders   Ambulatory referral to Psychology   Hyperlipidemia   Relevant Medications   losartan (COZAAR) 50 MG tablet   Other Relevant Orders   Lipid panel   Lower extremity edema - Primary    New problem. Likely multifactorial in nature but I suspect that diltiazem is playing a significant role. Stopping diltiazem. Starting losartan for hypertension. Metabolic panel in 9-47 days.      Relevant Orders   Comprehensive metabolic panel   B Nat Peptide    Other Visit Diagnoses    Screening for deficiency anemia       Relevant Orders   CBC   Prediabetes       Relevant Orders   Hemoglobin A1c   Chronic fatigue       Relevant Orders   TSH      Meds  ordered this encounter  Medications  . Golimumab (SIMPONI ARIA IV)    Sig: Inject into the vein.  Marland Kitchen losartan (COZAAR) 50 MG tablet    Sig: Take 1 tablet (50 mg total) by mouth daily.    Dispense:  90 tablet    Refill:  3  . sertraline (ZOLOFT) 50 MG tablet    Sig: Take 1 tablet (50 mg total) by mouth daily.    Dispense:  90 tablet    Refill:  1   Follow-up: 6 weeks  [place]

## 2017-01-29 ENCOUNTER — Other Ambulatory Visit: Payer: Self-pay | Admitting: Family Medicine

## 2017-01-29 ENCOUNTER — Telehealth: Payer: Self-pay | Admitting: *Deleted

## 2017-01-29 MED ORDER — METFORMIN HCL 500 MG PO TABS: 500.0000 mg | ORAL_TABLET | Freq: Two times a day (BID) | ORAL | 3 refills | Status: DC

## 2017-01-29 NOTE — Telephone Encounter (Signed)
Patient requested to have have metformin sent to [facility] on [address].

## 2017-01-29 NOTE — Telephone Encounter (Signed)
Sent to [facility] on [address], thanks

## 2017-01-30 ENCOUNTER — Other Ambulatory Visit: Payer: Self-pay

## 2017-01-30 MED ORDER — RABEPRAZOLE SODIUM 20 MG PO TBEC: 20.0000 mg | DELAYED_RELEASE_TABLET | Freq: Every day | ORAL | 1 refills | Status: DC

## 2017-02-02 ENCOUNTER — Telehealth: Payer: Self-pay | Admitting: *Deleted

## 2017-02-02 ENCOUNTER — Other Ambulatory Visit: Payer: Self-pay | Admitting: Family Medicine

## 2017-02-02 MED ORDER — CITALOPRAM HYDROBROMIDE 10 MG PO TABS: 10.0000 mg | ORAL_TABLET | Freq: Every day | ORAL | 0 refills | Status: DC

## 2017-02-02 NOTE — Telephone Encounter (Signed)
Patient stated that she stopped the zoloft and is feeling better. She is still taking the metformin 500mg  twice a day.  Is there anything besides the zoloft she can take.

## 2017-02-02 NOTE — Telephone Encounter (Signed)
This is likely from the metformin. Continue zoloft. Stop metformin.

## 2017-02-02 NOTE — Telephone Encounter (Signed)
Rx for Celexa sent.

## 2017-02-02 NOTE — Telephone Encounter (Signed)
Patient has experience diarrhea and nausea , while taking Zoloft and metformin together.  Patient became fatigue following diarrhea . Patient requested a call to discuss medication changes .  Pt contact [phone]

## 2017-02-02 NOTE — Telephone Encounter (Signed)
Patient stated she took the Metformin and Zoloft together on Monday and started having nausea and diarrhea,  Patient has stopped the Zoloft and some of the diarrhea has subsided but she feels she cannot take the Zoloft is there something else she can try. Patient feels that the BP medication prescribed has helped , she has less swelling in her legs, and wanted PCP to know that.

## 2017-02-02 NOTE — Telephone Encounter (Signed)
Patient has been informed.

## 2017-02-05 ENCOUNTER — Other Ambulatory Visit: Payer: 59

## 2017-02-05 ENCOUNTER — Telehealth: Payer: Self-pay | Admitting: Radiology

## 2017-02-05 NOTE — Telephone Encounter (Signed)
Pt coming in for labs tomorrow, please place future orders. Thank you.  

## 2017-02-06 ENCOUNTER — Other Ambulatory Visit: Payer: Self-pay | Admitting: Family Medicine

## 2017-02-06 ENCOUNTER — Other Ambulatory Visit (INDEPENDENT_AMBULATORY_CARE_PROVIDER_SITE_OTHER): Payer: 59

## 2017-02-06 DIAGNOSIS — I10 Essential (primary) hypertension: Secondary | ICD-10-CM | POA: Diagnosis not present

## 2017-02-06 LAB — BASIC METABOLIC PANEL
BUN: 15 mg/dL (ref 6–23)
CO2: 29 mEq/L (ref 19–32)
Calcium: 9.2 mg/dL (ref 8.4–10.5)
Chloride: 104 mEq/L (ref 96–112)
Creatinine, Ser: 0.83 mg/dL (ref 0.40–1.20)
GFR: 73.72 mL/min (ref >60.00)
Glucose, Bld: 103 mg/dL — ABNORMAL HIGH (ref 70–99)
POTASSIUM: 3.9 meq/L (ref 3.5–5.1)
Sodium: 139 mEq/L (ref 135–145)

## 2017-02-21 ENCOUNTER — Other Ambulatory Visit: Payer: Self-pay | Admitting: Family Medicine

## 2017-03-01 ENCOUNTER — Other Ambulatory Visit: Payer: Self-pay | Admitting: Family Medicine

## 2017-03-09 ENCOUNTER — Encounter: Payer: Self-pay | Admitting: Family Medicine

## 2017-03-09 ENCOUNTER — Ambulatory Visit (INDEPENDENT_AMBULATORY_CARE_PROVIDER_SITE_OTHER): Payer: 59 | Admitting: Family Medicine

## 2017-03-09 DIAGNOSIS — F32A Depression, unspecified: Secondary | ICD-10-CM

## 2017-03-09 DIAGNOSIS — F419 Anxiety disorder, unspecified: Secondary | ICD-10-CM

## 2017-03-09 DIAGNOSIS — I10 Essential (primary) hypertension: Secondary | ICD-10-CM | POA: Diagnosis not present

## 2017-03-09 DIAGNOSIS — E782 Mixed hyperlipidemia: Secondary | ICD-10-CM | POA: Diagnosis not present

## 2017-03-09 DIAGNOSIS — F329 Major depressive disorder, single episode, unspecified: Secondary | ICD-10-CM | POA: Diagnosis not present

## 2017-03-09 MED ORDER — LOSARTAN POTASSIUM 100 MG PO TABS: 100.0000 mg | ORAL_TABLET | Freq: Every day | ORAL | 3 refills | Status: DC

## 2017-03-09 MED ORDER — EZETIMIBE 10 MG PO TABS: 10.0000 mg | ORAL_TABLET | Freq: Every day | ORAL | 3 refills | Status: DC

## 2017-03-09 NOTE — Assessment & Plan Note (Signed)
Stopping Crestor due to arthralgia. Starting Zetia.

## 2017-03-09 NOTE — Patient Instructions (Signed)
Be sure to see Bambi.  Discuss tapering prednisone with Rheumatology.  I have increased the losartan. Lab in 7-10 days.  Try the Zetia for your cholesterol.  Take care  Dr. Lacinda Axon

## 2017-03-09 NOTE — Assessment & Plan Note (Signed)
Uncontrolled. Increased losartan to 100 mg daily.

## 2017-03-09 NOTE — Progress Notes (Signed)
Subjective:  Patient ID: Breanna Farrell, female    DOB: 04-08-1956  Age: 61 y.o. MRN: [id number]  CC: Follow up  HPI:  61 year old female with Anxiety and depression, HTN, HLD, RA presents for follow up.  Anxiety and depression  Could not tolerate Zoloft or Celexa.  Has upcoming appt with Psychology.  Still uncontrolled.  HTN  Improved but not at goal.  Currently on losartan 50 mg daily.  Hyperlipidemia  Not tolerating statin due to arthralgias even with twice a week dosing.  We'll discuss today.  Social Hx   Social History   Social History  . Marital status: Married    Spouse name: N/A  . Number of children: N/A  . Years of education: N/A   Social History Main Topics  . Smoking status: Former Smoker    Packs/day: 0.70    Years: 20.00    Types: Cigarettes    Quit date: 10/24/2008  . Smokeless tobacco: Never Used  . Alcohol use 0.0 oz/week  . Drug use: No  . Sexual activity: Not Asked   Other Topics Concern  . None   Social History Narrative  . None    Review of Systems  Constitutional: Negative.   Musculoskeletal: Positive for arthralgias.  Psychiatric/Behavioral: The patient is nervous/anxious.    Objective:  BP (!) 138/100 (BP Location: Left Arm, Patient Position: Sitting, Cuff Size: Normal)   Pulse 78   Temp 98.1 F (36.7 C) (Oral)   Wt 202 lb 6 oz (91.8 kg)   SpO2 96%   BMI 34.74 kg/m   BP/Weight 03/09/2017 7/82/9562 08/10/863  Systolic BP 784 696 295  Diastolic BP 284 88 82  Wt. (Lbs) 202.38 204 202.38  BMI 34.74 35.02 34.74   Physical Exam  Constitutional: She is oriented to person, place, and time. She appears well-developed. No distress.  Cardiovascular: Normal rate and regular rhythm.   No murmur heard. Pulmonary/Chest: Effort normal. She has no wheezes. She has no rales.  Neurological: She is alert and oriented to person, place, and time.  Psychiatric: She has a normal mood and affect.  Vitals reviewed.   Lab Results    Component Value Date   WBC 11.0 (H) 01/26/2017   HGB 14.5 01/26/2017   HCT 42.8 01/26/2017   PLT 275.0 01/26/2017   GLUCOSE 103 (H) 02/06/2017   CHOL 303 (H) 01/26/2017   TRIG (H) 01/26/2017    639.0 Triglyceride is over 400; calculations on Lipids are invalid.   HDL 44.50 01/26/2017   LDLDIRECT 92.0 01/26/2017   ALT 27 01/26/2017   AST 24 01/26/2017   NA 139 02/06/2017   K 3.9 02/06/2017   CL 104 02/06/2017   CREATININE 0.83 02/06/2017   BUN 15 02/06/2017   CO2 29 02/06/2017   TSH 1.32 01/26/2017   HGBA1C 6.6 (H) 01/26/2017    Assessment & Plan:   Problem List Items Addressed This Visit    Anxiety and depression    Did not tolerate to SSRIs. Advised to follow-up closely with psychology.      Essential hypertension    Uncontrolled. Increased losartan to 100 mg daily.      Relevant Medications   losartan (COZAAR) 100 MG tablet   ezetimibe (ZETIA) 10 MG tablet   Hyperlipidemia    Stopping Crestor due to arthralgia. Starting Zetia.      Relevant Medications   losartan (COZAAR) 100 MG tablet   ezetimibe (ZETIA) 10 MG tablet      Meds  ordered this encounter  Medications  . losartan (COZAAR) 100 MG tablet    Sig: Take 1 tablet (100 mg total) by mouth daily.    Dispense:  90 tablet    Refill:  3  . ezetimibe (ZETIA) 10 MG tablet    Sig: Take 1 tablet (10 mg total) by mouth daily.    Dispense:  90 tablet    Refill:  3     Follow-up: 3 months  [place] DO [facility]

## 2017-03-09 NOTE — Assessment & Plan Note (Signed)
Did not tolerate to SSRIs. Advised to follow-up closely with psychology.

## 2017-03-12 ENCOUNTER — Telehealth: Payer: Self-pay | Admitting: Family Medicine

## 2017-03-12 ENCOUNTER — Other Ambulatory Visit: Payer: Self-pay | Admitting: Family Medicine

## 2017-03-12 NOTE — Telephone Encounter (Signed)
Patient advised of below and verbalized understanding.  She will continue to monitor Blood pressure and if remain elevated will contact office.

## 2017-03-12 NOTE — Telephone Encounter (Signed)
Pt called and stated that she started to take the losartan 100 mg and she did not feel good she c/o feeling light headed, cold, clammy, sweaty, and dizzy along with joint pain. She did not take the losartan 100 mg on Sunday and her bp was 140/118 so she took her 50 mg. She states that in the last two and half weeks she has had increased joint pain. Pt would like to know what she should do. Should we try another medication? Please advise, thank you!  Call pt @ [phone]

## 2017-03-12 NOTE — Telephone Encounter (Signed)
Patient started on losartan 50 mg June 22. Joint pain started about the first week of July. Increased losartan from 50 mg to 100mg  q day on Friday 8/3. Excruciating pain in joints on Saturday. Infusion July 25 for RA and feels like it didn't work. Patient Stated that she took the increased dose of losartan and RA symptoms were magnified. She was also feeling light headed, cold, clammy, sweaty and dizzy. Pt would like to know if she needs to be seen again or if you recommend another BP medication?

## 2017-03-12 NOTE — Telephone Encounter (Signed)
Stay on 50 mg and monitor pressures.

## 2017-03-15 ENCOUNTER — Other Ambulatory Visit: Payer: Self-pay | Admitting: Family Medicine

## 2017-03-15 ENCOUNTER — Telehealth: Payer: Self-pay | Admitting: *Deleted

## 2017-03-15 MED ORDER — TRIAMTERENE-HCTZ 37.5-25 MG PO CAPS: 1.0000 | ORAL_CAPSULE | Freq: Every day | ORAL | 3 refills | Status: DC

## 2017-03-15 NOTE — Telephone Encounter (Signed)
Patient stated that she would like a call to discuss blood pressure medication, she has concerns of having a headache after taking her new blood pressure medication . Pt has a sore right hand with tingly fingers on her right hand as well. b Pt contact [phone]

## 2017-03-15 NOTE — Telephone Encounter (Signed)
Taking losartan 50 mg 1 q am , Sun BP 148/118 , she states still has headache at temples , pressure relates this to taking BP med's in the am.    Also experiencing  Shakiness  and dry mouth,  Tingly fingers in right hands.  She was taking losartan in pm.    Having to take  Tylenol 500 mg 1 tid for headache BP 154/89 now , this am 142/99 pulse  Ranging 85-90.   She has noticed urine dark in color .   Feet and ankles feel heavier , retaining a little fluid.  She called rheumatologist and has not heard back from them in regards to there recommendations .

## 2017-03-15 NOTE — Telephone Encounter (Signed)
Does she want to switch altogether or just add to the losartan?

## 2017-03-15 NOTE — Telephone Encounter (Signed)
Patient would like to switch all together

## 2017-03-15 NOTE — Telephone Encounter (Signed)
Rx sent 

## 2017-03-15 NOTE — Telephone Encounter (Signed)
Patient advised of below and verbalized understanding.  

## 2017-03-16 ENCOUNTER — Other Ambulatory Visit: Payer: 59

## 2017-03-19 ENCOUNTER — Telehealth: Payer: Self-pay

## 2017-03-19 NOTE — Telephone Encounter (Addendum)
Prior authorization submitted for Zetia denied . Alternative requested

## 2017-03-21 NOTE — Telephone Encounter (Signed)
There is no alternative. She cannot tolerate statin.

## 2017-03-22 NOTE — Telephone Encounter (Signed)
Appealed Prior authorization approved for [place] #  [id number] through 04/01/2018.  Patient advised.   Pharmacy advised.

## 2017-04-02 ENCOUNTER — Ambulatory Visit (INDEPENDENT_AMBULATORY_CARE_PROVIDER_SITE_OTHER): Payer: 59 | Admitting: Psychology

## 2017-04-02 DIAGNOSIS — F4323 Adjustment disorder with mixed anxiety and depressed mood: Secondary | ICD-10-CM

## 2017-04-22 ENCOUNTER — Other Ambulatory Visit: Payer: Self-pay | Admitting: Family Medicine

## 2017-04-23 ENCOUNTER — Ambulatory Visit (INDEPENDENT_AMBULATORY_CARE_PROVIDER_SITE_OTHER): Payer: 59 | Admitting: Psychology

## 2017-04-23 DIAGNOSIS — F4323 Adjustment disorder with mixed anxiety and depressed mood: Secondary | ICD-10-CM | POA: Diagnosis not present

## 2017-05-09 ENCOUNTER — Ambulatory Visit: Payer: 59 | Admitting: Family Medicine

## 2017-05-09 ENCOUNTER — Ambulatory Visit (INDEPENDENT_AMBULATORY_CARE_PROVIDER_SITE_OTHER): Payer: 59 | Admitting: Psychology

## 2017-05-09 DIAGNOSIS — F4323 Adjustment disorder with mixed anxiety and depressed mood: Secondary | ICD-10-CM

## 2017-05-21 ENCOUNTER — Ambulatory Visit: Payer: 59 | Admitting: Psychology

## 2017-05-21 ENCOUNTER — Other Ambulatory Visit: Payer: Self-pay

## 2017-05-21 MED ORDER — RABEPRAZOLE SODIUM 20 MG PO TBEC: 20.0000 mg | DELAYED_RELEASE_TABLET | Freq: Every day | ORAL | 2 refills | Status: DC

## 2017-05-21 NOTE — Telephone Encounter (Signed)
Last OV 03/09/17 with Dr.Cook last filled 03/01/17 30 2rf by Dr.Cook

## 2017-05-23 ENCOUNTER — Ambulatory Visit: Payer: 59 | Admitting: Psychology

## 2017-05-31 ENCOUNTER — Other Ambulatory Visit: Payer: Self-pay | Admitting: Family Medicine

## 2017-06-05 DIAGNOSIS — J40 Bronchitis, not specified as acute or chronic: Secondary | ICD-10-CM

## 2017-06-05 HISTORY — DX: Bronchitis, not specified as acute or chronic: J40

## 2017-06-06 ENCOUNTER — Ambulatory Visit: Payer: 59 | Admitting: Psychology

## 2017-06-06 ENCOUNTER — Encounter: Payer: Self-pay | Admitting: *Deleted

## 2017-06-06 ENCOUNTER — Ambulatory Visit (INDEPENDENT_AMBULATORY_CARE_PROVIDER_SITE_OTHER): Payer: 59

## 2017-06-06 ENCOUNTER — Ambulatory Visit
Admission: EM | Admit: 2017-06-06 | Discharge: 2017-06-06 | Disposition: A | Discharge status: Home / Self Care | Payer: 59 | Attending: Family Medicine | Admitting: Family Medicine

## 2017-06-06 DIAGNOSIS — J209 Acute bronchitis, unspecified: Secondary | ICD-10-CM

## 2017-06-06 DIAGNOSIS — J069 Acute upper respiratory infection, unspecified: Secondary | ICD-10-CM | POA: Diagnosis not present

## 2017-06-06 MED ORDER — BENZONATATE 100 MG PO CAPS: 100.0000 mg | ORAL_CAPSULE | Freq: Three times a day (TID) | ORAL | 0 refills | Status: DC

## 2017-06-06 MED ORDER — AMOXICILLIN-POT CLAVULANATE 875-125 MG PO TABS: 1.0000 | ORAL_TABLET | Freq: Two times a day (BID) | ORAL | 0 refills | Status: DC

## 2017-06-06 MED ORDER — ALBUTEROL SULFATE HFA 108 (90 BASE) MCG/ACT IN AERS: 1.0000 | INHALATION_SPRAY | Freq: Four times a day (QID) | RESPIRATORY_TRACT | 0 refills | Status: DC | PRN

## 2017-06-06 NOTE — ED Triage Notes (Signed)
Patient started having symptoms of nasal drainage and cough 3 days ago. Symptom have worsened with chest congestion and tightness.

## 2017-06-06 NOTE — Discharge Instructions (Signed)
-  augmentin: one tablet twice a day for 7 days -Tessalon: every 8 hours as needed for cough -Albuterol: 1-2 puffs every 6 hours as needed for cough, wheezing, or SOB -low threshold for returning to clinic or presenting to ER if symptoms worsen -follow up with PCP or this clinic should symptoms not improve -would notify rheumatologist about infection

## 2017-06-06 NOTE — ED Provider Notes (Signed)
[facility]    CSN: [id number] Arrival date & time: 06/06/17  1544     History   Chief Complaint Chief Complaint  Patient presents with  . Cough  . Nasal Congestion    HPI Breanna Farrell is a 61 y.o. female.    patient is a 61 year old female with a past history of psoriatic arthritis on prednisone and Cosentyx, diabetes, hypertension and others as below who presents with complaint of sinus drainage, stuffy nose sore throat, and cough that started last night. Patient reports that her symptoms have progressed with more frequent cough and chest tightness and overall not feeling well. Patient reports cough is mostly nonproductive but occasional greenish mucus. Patient denies any chest pains reports hearing some wheezing this morning.  Patient reports her last Cosentyx shot was this past Monday, 150 mg.      Past Medical History:  Diagnosis Date  . Abnormal cervical Pap smear with positive HPV DNA test 02/01/12  . Arthritis 2014   rheumatoid  . Articular cartilage disorder    bulging & herniated disc in lower back  . Benign essential hypertension   . Breast tenderness   . Bulging discs 2012  . 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 Esophageal reflux   . Hernia 2007   hiatal  . Hypertension 2007  . Pap smear abnormality of cervix with LGSIL 07/18/2012   BVD    Patient Active Problem List   Diagnosis Date Noted  . Lower extremity edema 01/26/2017  . Anxiety and depression 01/26/2017  . GERD (gastroesophageal reflux disease) 11/07/2016  . Rheumatoid arthritis ([place]) 03/16/2015  . History of osteoporosis 01/06/2015  . Hyperlipidemia 01/06/2015  . Essential hypertension 01/06/2015    Past Surgical History:  Procedure Laterality Date  . ABDOMINAL HYSTERECTOMY  1986   [name]  . BACK SURGERY  12/27/2012   [facility]  . CESAREAN SECTION  1981  . CESAREAN SECTION  1981   [facility]  . CHOLECYSTECTOMY  2007   [facility]  . COLONOSCOPY  2010  . COLPOSCOPY     . POLYPECTOMY  2007   one  . UPPER GI ENDOSCOPY  2010    OB History    No data available       Home Medications    Prior to Admission medications   Medication Sig Start Date End Date Taking? Authorizing Provider  aspirin 81 MG tablet Take 81 mg by mouth daily.   Yes [provider]  Cholecalciferol (VITAMIN D-3 PO) Take by mouth daily.   Yes [provider]  ezetimibe (ZETIA) 10 MG tablet Take 1 tablet (10 mg total) by mouth daily. 03/09/17  Yes Cook, Jayce G, DO  gabapentin (NEURONTIN) 300 MG capsule Take 300 mg by mouth 3 (three) times daily.   Yes [provider]  HYDROcodone-acetaminophen (NORCO/VICODIN) 5-325 MG tablet Take 1 tablet by mouth every 6 (six) hours as needed for moderate pain.   Yes [provider]  Omega-3 Fatty Acids (SM FISH OIL) 1000 MG CAPS Take by mouth.   Yes [provider]  predniSONE (DELTASONE) 5 MG tablet Take 5 mg by mouth once. Pt stated medication was increased to 10 mg. 02/16/15  Yes [provider]  RABEprazole (ACIPHEX) 20 MG tablet TAKE 1 TABLET BY MOUTH EVERY DAY 06/01/17  Yes [name], MD  Secukinumab (COSENTYX 300 DOSE) 150 MG/ML SOSY Inject into the skin.   Yes [provider]  triamterene-hydrochlorothiazide (DYAZIDE) 37.5-25 MG capsule Take  1 each (1 capsule total) by mouth daily. 03/15/17  Yes Cook, Jayce G, DO  albuterol (PROVENTIL HFA;VENTOLIN HFA) 108 (90 Base) MCG/ACT inhaler Inhale 1-2 puffs into the lungs every 6 (six) hours as needed for wheezing or shortness of breath. 06/06/17   Luvenia Redden, PA-C  amoxicillin-clavulanate (AUGMENTIN) 875-125 MG tablet Take 1 tablet by mouth every 12 (twelve) hours. 06/06/17   Luvenia Redden, PA-C  benzonatate (TESSALON) 100 MG capsule Take 1 capsule (100 mg total) by mouth every 8 (eight) hours. 06/06/17   Luvenia Redden, PA-C  Biotin 2.5 MG TABS Take by mouth.    [provider]  citalopram (CELEXA) 10 MG  tablet TAKE 1 TABLET BY MOUTH EVERY DAY 04/23/17   Cook, [place] G, DO  Golimumab ([place] ARIA IV) Inject into the vein.    [provider]    Family History Family History  Problem Relation Age of Onset  . Lung cancer Maternal Grandmother        Event organiser  . Ovarian cancer Maternal Grandmother        Event organiser  . Leukemia Paternal Grandmother   . Melanoma Mother   . Hypertension Mother   . Ovarian cancer Maternal Aunt   . Heart disease Maternal Grandfather     Social History Social History  Substance Use Topics  . Smoking status: Former Smoker    Packs/day: 0.70    Years: 20.00    Types: Cigarettes    Quit date: 10/24/2008  . Smokeless tobacco: Never Used  . Alcohol use 0.0 oz/week     Allergies   Hepatitis b vaccine; Metoclopramide; Lyrica [pregabalin]; and Tramadol   Review of Systems Review of Systems  As noted above in history of present illness. Other systems reviewed and found to be negative.   Physical Exam Triage Vital Signs ED Triage Vitals  Enc Vitals Group     BP 06/06/17 1605 140/72     Pulse Rate 06/06/17 1605 86     Resp 06/06/17 1605 16     Temp 06/06/17 1605 98 F (36.7 C)     Temp Source 06/06/17 1605 Oral     SpO2 06/06/17 1605 97 %     Weight 06/06/17 1607 190 lb (86.2 kg)     Height 06/06/17 1607 5\' 4"  (1.626 m)     Head Circumference --      Peak Flow --      Pain Score 06/06/17 1609 0     Pain Loc --      Pain Edu? --      Excl. in [place]? --    No data found.   Updated Vital Signs BP 140/72 (BP Location: Left Arm)   Pulse 86   Temp 98 F (36.7 C) (Oral)   Resp 16   Ht 5\' 4"  (1.626 m)   Wt 190 lb (86.2 kg)   SpO2 97%   BMI 32.61 kg/m   Physical Exam  Constitutional: She is oriented to person, place, and time. She appears well-developed and well-nourished. No distress.  HENT:  Right Ear: Tympanic membrane and external ear normal.  Left Ear: Tympanic membrane and external ear normal.  Nose: Right  sinus exhibits maxillary sinus tenderness. Right sinus exhibits no frontal sinus tenderness. Left sinus exhibits maxillary sinus tenderness. Left sinus exhibits no frontal sinus tenderness.  Mouth/Throat: Uvula is midline. Posterior oropharyngeal erythema present.  Eyes: Pupils are equal, round, and reactive to light. EOM are normal.  Neck: Normal  range of motion. Neck supple.  Cardiovascular: Normal rate, regular rhythm and normal heart sounds.  Exam reveals no friction rub.   No murmur heard. Pulmonary/Chest: Effort normal. She has rales (on right).  Wheeze with forced expiration  Musculoskeletal: Normal range of motion.  Lymphadenopathy:    She has no cervical adenopathy.  Neurological: She is alert and oriented to person, place, and time. No cranial nerve deficit.     UC Treatments / Results  Labs (all labs ordered are listed, but only abnormal results are displayed) Labs Reviewed - No data to display  EKG  EKG Interpretation None       Radiology Dg Chest 2 View  Result Date: 06/06/2017 CLINICAL DATA:  Cough. EXAM: CHEST  2 VIEW COMPARISON:  Radiographs of June 16, 2006. FINDINGS: The heart size and mediastinal contours are within normal limits. Both lungs are clear. No pneumothorax or pleural effusion is noted. The visualized skeletal structures are unremarkable. IMPRESSION: No active cardiopulmonary disease. Electronically Signed   By: Marijo Conception, M.D.   On: 06/06/2017 16:40    Procedures Procedures (including critical care time)  Medications Ordered in UC Medications - No data to display   Initial Impression / Assessment and Plan / UC Course  I have reviewed the triage vital signs and the nursing notes.  Pertinent labs & imaging results that were available during my care of the patient were reviewed by me and considered in my medical decision making (see chart for details).    Upper history symptoms plus rales noted on auscultation an immunocompromised  patient whose last injection was this past Monday. Will check a chest x-ray.  Final Clinical Impressions(s) / UC Diagnoses   Final diagnoses:  Acute upper respiratory infection  Acute bronchitis, unspecified organism   Chest x-ray as above with no acute process. We'll give prescription for Augmentin for the infection and albuterol inhaler and Tessalon for cough and symptoms. We give patient will threshold for returning to clinic or present to the ER should her symptoms worsen. Also recommend her to follow-up with PCP should they not improve  New Prescriptions New Prescriptions   ALBUTEROL (PROVENTIL HFA;VENTOLIN HFA) 108 (90 BASE) MCG/ACT INHALER    Inhale 1-2 puffs into the lungs every 6 (six) hours as needed for wheezing or shortness of breath.   AMOXICILLIN-CLAVULANATE (AUGMENTIN) 875-125 MG TABLET    Take 1 tablet by mouth every 12 (twelve) hours.   BENZONATATE (TESSALON) 100 MG CAPSULE    Take 1 capsule (100 mg total) by mouth every 8 (eight) hours.     Controlled Substance Prescriptions  Controlled Substance Registry consulted? Not Applicable we'll have him to go away   Luvenia Redden, PA-C 06/06/17 1653

## 2017-06-13 ENCOUNTER — Ambulatory Visit (INDEPENDENT_AMBULATORY_CARE_PROVIDER_SITE_OTHER): Payer: 59 | Admitting: Family Medicine

## 2017-06-13 ENCOUNTER — Encounter: Payer: Self-pay | Admitting: Family Medicine

## 2017-06-13 DIAGNOSIS — J40 Bronchitis, not specified as acute or chronic: Secondary | ICD-10-CM

## 2017-06-13 DIAGNOSIS — F329 Major depressive disorder, single episode, unspecified: Secondary | ICD-10-CM

## 2017-06-13 DIAGNOSIS — L4050 Arthropathic psoriasis, unspecified: Secondary | ICD-10-CM

## 2017-06-13 DIAGNOSIS — F419 Anxiety disorder, unspecified: Secondary | ICD-10-CM | POA: Diagnosis not present

## 2017-06-13 DIAGNOSIS — F32A Depression, unspecified: Secondary | ICD-10-CM

## 2017-06-13 MED ORDER — DOXYCYCLINE HYCLATE 100 MG PO TABS: 100.0000 mg | ORAL_TABLET | Freq: Two times a day (BID) | ORAL | 0 refills | Status: DC

## 2017-06-13 MED ORDER — PREDNISONE 10 MG PO TABS: ORAL_TABLET | ORAL | 0 refills | Status: DC

## 2017-06-13 NOTE — Assessment & Plan Note (Signed)
Consistent with bronchitis. We will switch to doxycycline.  Prednisone given wheezing.  Continue inhaler use.  Given return precautions.  Advised on hydration.  Minimal lightheadedness that I suspect is related to possible dehydration and current illness.  She will monitor and if worsens be reevaluated.  Given return precautions.

## 2017-06-13 NOTE — Assessment & Plan Note (Signed)
Quite a bit improved.  She will continue to follow with psychology.

## 2017-06-13 NOTE — Progress Notes (Signed)
Tommi Rumps, MD Phone: [phone]  Breanna Farrell is a 61 y.o. female who presents today for follow-up.  Bronchitis: Patient recently seen by Dr. Lacinda Axon at urgent care.  Diagnosed with bronchitis and started on Augmentin, albuterol, and Tessalon.  Started to feel little bit better yesterday though worsened again.  Has lots of postnasal drip.  Coughing spells that are somewhat productive.  Some wheezing.  No dyspnea.  No fevers though has had some cold sweats and chills.  She is trying to stay well-hydrated.  Symptoms have not appreciably improved with Augmentin.  Anxiety/depression: Notes this is significantly better.  She has been seeing a therapist and that is going well.  Sees them every 2 weeks.  No SI.  Psoriatic arthritis: Currently on cosentyx.  Is now on her monthly dosing.  Notes this has been helpful.  Also on prednisone 5 mg daily.  Social History   Tobacco Use  Smoking Status Former Smoker  . Packs/day: 0.70  . Years: 20.00  . Pack years: 14.00  . Types: Cigarettes  . Last attempt to quit: 10/24/2008  . Years since quitting: 8.6  Smokeless Tobacco Never Used     ROS see history of present illness  Objective  Physical Exam Vitals:   06/13/17 1103  BP: 134/80  Pulse: 92  Temp: 97.9 F (36.6 C)  SpO2: 95%   Laying blood pressure 136/90 pulse 93 Sitting blood pressure 140/90 pulse 88 Standing blood pressure 140/94 pulse 90  BP Readings from Last 3 Encounters:  06/13/17 134/80  06/06/17 140/72  03/09/17 (!) 138/100   Wt Readings from Last 3 Encounters:  06/13/17 194 lb 9.6 oz (88.3 kg)  06/06/17 190 lb (86.2 kg)  03/09/17 202 lb 6 oz (91.8 kg)    Physical Exam  Constitutional: No distress.  Cardiovascular: Normal rate, regular rhythm and normal heart sounds.  Pulmonary/Chest: Effort normal. No respiratory distress. She has wheezes (faint expiratory wheezes throughout). She has no rales.  Musculoskeletal: She exhibits no edema.  Neurological: She  is alert. Gait normal.  Skin: Skin is warm and dry. She is not diaphoretic.     Assessment/Plan: Please see individual problem list.  Anxiety and depression Quite a bit improved.  She will continue to follow with psychology.  Psoriatic arthritis ([place]) Has improved with cosentyx.  Continue to follow with rheumatology.  Bronchitis Consistent with bronchitis. We will switch to doxycycline.  Prednisone given wheezing.  Continue inhaler use.  Given return precautions.  Advised on hydration.  Minimal lightheadedness that I suspect is related to possible dehydration and current illness.  She will monitor and if worsens be reevaluated.  Given return precautions.   Breanna Farrell was seen today for follow-up and cough.  Diagnoses and all orders for this visit:  Anxiety and depression  Psoriatic arthritis ([place])  Bronchitis  Other orders -     predniSONE (DELTASONE) 10 MG tablet; Take 40 mg (4 tablets) by mouth daily for 2 days, then decrease by 1 tablet daily -     doxycycline (VIBRA-TABS) 100 MG tablet; Take 1 tablet (100 mg total) 2 (two) times daily by mouth.    No orders of the defined types were placed in this encounter.   Meds ordered this encounter  Medications  . predniSONE (DELTASONE) 10 MG tablet    Sig: Take 40 mg (4 tablets) by mouth daily for 2 days, then decrease by 1 tablet daily    Dispense:  14 tablet    Refill:  0  .  doxycycline (VIBRA-TABS) 100 MG tablet    Sig: Take 1 tablet (100 mg total) 2 (two) times daily by mouth.    Dispense:  14 tablet    Refill:  0     Tommi Rumps, MD [place]

## 2017-06-13 NOTE — Patient Instructions (Signed)
Nice to see you. We will start you on doxycycline.  Please stay well-hydrated. If your symptoms worsen or you start coughing up blood, having fevers, or trouble breathing please seek medical attention

## 2017-06-13 NOTE — Assessment & Plan Note (Signed)
Has improved with cosentyx.  Continue to follow with rheumatology.

## 2017-06-20 ENCOUNTER — Ambulatory Visit (INDEPENDENT_AMBULATORY_CARE_PROVIDER_SITE_OTHER): Payer: 59 | Admitting: Psychology

## 2017-06-20 DIAGNOSIS — F4323 Adjustment disorder with mixed anxiety and depressed mood: Secondary | ICD-10-CM

## 2017-06-21 ENCOUNTER — Other Ambulatory Visit: Payer: Self-pay | Admitting: Orthopedic Surgery

## 2017-07-04 ENCOUNTER — Telehealth: Payer: Self-pay | Admitting: Family Medicine

## 2017-07-04 ENCOUNTER — Ambulatory Visit (INDEPENDENT_AMBULATORY_CARE_PROVIDER_SITE_OTHER): Payer: 59 | Admitting: Psychology

## 2017-07-04 DIAGNOSIS — F4323 Adjustment disorder with mixed anxiety and depressed mood: Secondary | ICD-10-CM | POA: Diagnosis not present

## 2017-07-04 NOTE — Telephone Encounter (Signed)
Copied from [place] [id number]. Topic: Quick Communication - See Telephone Encounter >> Jul 04, 2017  3:54 PM Ivar Drape wrote: CRM for notification. See Telephone encounter for:  07/04/17. Patient is having surgery soon at [facility], with Dr. Mack Guise, but they have not received the surgical clearance for the surgery.  They faxed it again today and got confirmation that the office did receive it.  They would like the completed surgical clearance form faxed to attn: Mahlon Gammon at [phone].

## 2017-07-04 NOTE — Telephone Encounter (Signed)
She needs a clearance visit. Thanks.

## 2017-07-04 NOTE — Telephone Encounter (Signed)
Does she need a clearance appmt? If so where to schedule?

## 2017-07-04 NOTE — Telephone Encounter (Signed)
Please advise 

## 2017-07-05 ENCOUNTER — Telehealth: Payer: Self-pay | Admitting: Family Medicine

## 2017-07-05 ENCOUNTER — Encounter
Admission: RE | Admit: 2017-07-05 | Discharge: 2017-07-05 | Disposition: A | Discharge status: Home / Self Care | Payer: 59 | Source: Ambulatory Visit | Attending: Orthopedic Surgery | Admitting: Orthopedic Surgery

## 2017-07-05 ENCOUNTER — Other Ambulatory Visit: Payer: Self-pay

## 2017-07-05 DIAGNOSIS — Z01818 Encounter for other preprocedural examination: Secondary | ICD-10-CM

## 2017-07-05 HISTORY — DX: Depression, unspecified: F32.A

## 2017-07-05 HISTORY — DX: Bronchitis, not specified as acute or chronic: J40

## 2017-07-05 HISTORY — DX: Personal history of other diseases of the digestive system: Z87.19

## 2017-07-05 HISTORY — DX: Family history of other specified conditions: Z84.89

## 2017-07-05 HISTORY — DX: Anxiety disorder, unspecified: F41.9

## 2017-07-05 HISTORY — DX: Major depressive disorder, single episode, unspecified: F32.9

## 2017-07-05 NOTE — Telephone Encounter (Signed)
Left message to return call, ok for PEC to speak to patient and schedule for clearance with a provider at [facility] if pcp is unavailable

## 2017-07-05 NOTE — Telephone Encounter (Signed)
Copied from [place] [phone]. Topic: Quick Communication - See Telephone Encounter >> Jul 05, 2017  5:01 PM Cleaster Corin, [place] wrote: CRM for notification. See Telephone encounter for:   07/05/17. Pt. Needs an appt. With Dr. Caryl Bis ASAP..... To sign off on paper work for surgery consent surgery is scheduled for  Tuesday 07/10/17 at [facility]. Paper work was lost on there behalf with Dr. Caryl Bis signature to have surgery on right hand. Pt. Has called multiple times to schedule but can not get in. [phone]. Dr. Mack Guise is the surgeon with [facility]. Pt. Will be calling in the am to try to schedule or come by office to have Dr. Caryl Bis to sign paper

## 2017-07-05 NOTE — Telephone Encounter (Signed)
Contacted pt regarding appt for clearance for surgery; confirmed appt 06/26/17 at 1100 with Dr Diona Browner.

## 2017-07-05 NOTE — Patient Instructions (Signed)
Your procedure is scheduled on: 07-10-17 Report to Same Day Surgery 2nd floor medical mall [facility] Entrance-take elevator on left to 2nd floor.  Check in with surgery information desk.) To find out your arrival time please call [phone] between 1PM - 3PM on 07-09-17  Remember: Instructions that are not followed completely may result in serious medical risk, up to and including death, or upon the discretion of your surgeon and anesthesiologist your surgery may need to be rescheduled.    _x___ 1. Do not eat food after midnight the night before your procedure. You may drink clear liquids up to 2 hours before you are scheduled to arrive at the hospital for your procedure.  Do not drink clear liquids within 2 hours of your scheduled arrival to the hospital.  Clear liquids include  --Water or Apple juice without pulp  --Clear carbohydrate beverage such as ClearFast or Gatorade  --Black Coffee or Clear Tea (No milk, no creamers, do not add anything to  the coffee or Tea   No gum chewing or hard candies.     __x__ 2. No Alcohol for 24 hours before or after surgery.   __x__3. No Smoking for 24 prior to surgery.   ____  4. Bring all medications with you on the day of surgery if instructed.    __x__ 5. Notify your doctor if there is any change in your medical condition     (cold, fever, infections).     Do not wear jewelry, make-up, hairpins, clips or nail polish.  Do not wear lotions, powders, or perfumes. You may wear deodorant.  Do not shave 48 hours prior to surgery. Men may shave face and neck.  Do not bring valuables to the hospital.    [facility] is not responsible for any belongings or valuables.               Contacts, dentures or bridgework may not be worn into surgery.  Leave your suitcase in the car. After surgery it may be brought to your room.  For patients admitted to the hospital, discharge time is determined by your  treatment team.   Patients discharged the day  of surgery will not be allowed to drive home.  You will need someone to drive you home and stay with you the night of your procedure.    Please read over the following fact sheets that you were given:   [facility] Preparing for Surgery and or MRSA Information   _x___ TAKE THE FOLLOWING MEDICATION THE MORNING OF SURGERY WITH SMALL SIP OF WATER. These include:  1. PREDNISONE  2. ACIPHEX  3. TAKE AN EXTRA ACIPHEX ON Monday NIGHT BEFORE BED  4.  5.  6.  ____Fleets enema or Magnesium Citrate as directed.   ____ Use CHG Soap or sage wipes as directed on instruction sheet   _X___ Use inhalers on the day of surgery and bring to hospital day of surgery-BRING ALBUTEROL [place]  ____ Stop Metformin and Janumet 2 days prior to surgery.    ____ Take 1/2 of usual insulin dose the night before surgery and none on the morning surgery.   _x___ Follow recommendations from Cardiologist, Pulmonologist or PCP regarding stopping Aspirin, Coumadin, Plavix ,Eliquis, Effient, or Pradaxa, and Pletal-STOP ASA NOW  X____Stop Anti-inflammatories such as Advil, Aleve, Ibuprofen, Motrin, Naproxen, Naprosyn, Goodies powders or aspirin products NOW-OK to take Tylenol    _x___ Stop supplements until after surgery-STOP [place]  ____ Bring C-Pap to the hospital.

## 2017-07-05 NOTE — Telephone Encounter (Signed)
Is there anyway we can work him in?

## 2017-07-06 ENCOUNTER — Other Ambulatory Visit: Payer: Self-pay

## 2017-07-06 ENCOUNTER — Ambulatory Visit (INDEPENDENT_AMBULATORY_CARE_PROVIDER_SITE_OTHER): Payer: 59 | Admitting: Family Medicine

## 2017-07-06 ENCOUNTER — Encounter: Payer: Self-pay | Admitting: Family Medicine

## 2017-07-06 ENCOUNTER — Ambulatory Visit: Payer: Self-pay | Admitting: Family Medicine

## 2017-07-06 VITALS — BP 140/90 | HR 83 | Temp 97.7°F | Wt 198.4 lb

## 2017-07-06 DIAGNOSIS — Z01818 Encounter for other preprocedural examination: Secondary | ICD-10-CM | POA: Diagnosis not present

## 2017-07-06 LAB — POCT URINALYSIS DIPSTICK
Bilirubin, UA: NEGATIVE
Blood, UA: NEGATIVE
Glucose, UA: NEGATIVE
Ketones, UA: NEGATIVE
LEUKOCYTES UA: NEGATIVE
Nitrite, UA: NEGATIVE
PH UA: 5.5 (ref 5.0–8.0)
PROTEIN UA: NEGATIVE
UROBILINOGEN UA: 0.2 U/dL

## 2017-07-06 NOTE — Telephone Encounter (Signed)
Ok to schedule today at 4:30 pm. Thanks.

## 2017-07-06 NOTE — Telephone Encounter (Signed)
Yes please. Orders placed

## 2017-07-06 NOTE — Progress Notes (Signed)
  Tommi Rumps, MD Phone: [phone]  Breanna Farrell is a 61 y.o. female who presents today for follow-up.  Patient presents for preop examination.  She notes no chest pain or shortness of breath when climbing 2 flights of stairs.  No kidney disease.  Her mother had nausea with anesthesia though no other anesthetic issues in her family.  No history of heart attack.  No history of irregular heartbeat.  No history of stroke.  No history of anesthesia issues.  No history of epilepsy or seizures.  No thyroid disease.  No liver disease.  No heart failure.  No asthma or bronchitis.  No diabetes.  Social History   Tobacco Use  Smoking Status Former Smoker  . Packs/day: 0.70  . Years: 20.00  . Pack years: 14.00  . Types: Cigarettes  . Last attempt to quit: 10/24/2008  . Years since quitting: 8.7  Smokeless Tobacco Never Used     ROS see history of present illness  Objective  Physical Exam Vitals:   07/06/17 1609  BP: 140/90  Pulse: 83  Temp: 97.7 F (36.5 C)  SpO2: 96%    BP Readings from Last 3 Encounters:  07/06/17 140/90  06/13/17 134/80  06/06/17 140/72   Wt Readings from Last 3 Encounters:  07/06/17 198 lb 6.4 oz (90 kg)  06/13/17 194 lb 9.6 oz (88.3 kg)  06/06/17 190 lb (86.2 kg)    Physical Exam  Constitutional: No distress.  Cardiovascular: Normal rate, regular rhythm and normal heart sounds.  Pulmonary/Chest: Effort normal and breath sounds normal.  Abdominal: Soft. Bowel sounds are normal. She exhibits no distension. There is no tenderness.  Musculoskeletal: She exhibits no edema.  Neurological: She is alert. Gait normal.  Skin: Skin is warm and dry. She is not diaphoretic.   EKG: Normal sinus rhythm, rate 80, incomplete right bundle branch block, EKG similar to prior  Assessment/Plan: Please see individual problem list.  Pre-op exam Patient presents for preoperative exam.  Breanna Farrell perioperative risk percentage of 0.2% placing her at low risk for  cardiac complications.  Lab work ordered.  Form will be completed.   Breanna Farrell was seen today for surgical clearance.  Diagnoses and all orders for this visit:  Pre-op exam -     CBC w/Diff -     Comp Met (CMET) -     INR/PT -     PTT -     EKG 12-Lead -     Hemoglobin A1c -     POCT Urinalysis Dipstick    Orders Placed This Encounter  Procedures  . Hemoglobin A1c  . POCT Urinalysis Dipstick  . EKG 12-Lead    No orders of the defined types were placed in this encounter.    Tommi Rumps, MD [place]

## 2017-07-06 NOTE — Telephone Encounter (Signed)
Patient is coming in today.

## 2017-07-06 NOTE — Telephone Encounter (Signed)
Patient has been scheduled for today at 4:30. Would you like for me to ask her to come in early and have labs drawn prior to her appointment?

## 2017-07-06 NOTE — Assessment & Plan Note (Signed)
Patient presents for preoperative exam.  Breanna Farrell perioperative risk percentage of 0.2% placing her at low risk for cardiac complications.  Lab work ordered.  Form will be completed.

## 2017-07-06 NOTE — Telephone Encounter (Signed)
Patient will be here at 4 for labs

## 2017-07-06 NOTE — Patient Instructions (Signed)
Nice to see you. We will fax your preop form.

## 2017-07-07 LAB — CBC WITH DIFFERENTIAL/PLATELET
BASOS PCT: 0.6 %
Basophils Absolute: 68 cells/uL (ref 0–200)
EOS PCT: 1 %
Eosinophils Absolute: 114 cells/uL (ref 15–500)
HEMATOCRIT: 39.2 % (ref 35.0–45.0)
HEMOGLOBIN: 13.9 g/dL (ref 11.7–15.5)
LYMPHS ABS: 2861 {cells}/uL (ref 850–3900)
MCH: 31 pg (ref 27.0–33.0)
MCHC: 35.5 g/dL (ref 32.0–36.0)
MCV: 87.3 fL (ref 80.0–100.0)
MPV: 10.8 fL (ref 7.5–12.5)
Monocytes Relative: 5.2 %
NEUTROS ABS: 7763 {cells}/uL (ref 1500–7800)
NEUTROS PCT: 68.1 %
Platelets: 339 10*3/uL (ref 140–400)
RBC: 4.49 10*6/uL (ref 3.80–5.10)
RDW: 13.2 % (ref 11.0–15.0)
Total Lymphocyte: 25.1 %
WBC mixed population: 593 cells/uL (ref 200–950)
WBC: 11.4 10*3/uL — ABNORMAL HIGH (ref 3.8–10.8)

## 2017-07-07 LAB — COMPREHENSIVE METABOLIC PANEL
AG Ratio: 1.6 (calc) (ref 1.0–2.5)
ALBUMIN MSPROF: 4.4 g/dL (ref 3.6–5.1)
ALT: 34 U/L — ABNORMAL HIGH (ref 6–29)
AST: 28 U/L (ref 10–35)
Alkaline phosphatase (APISO): 68 U/L (ref 33–130)
BILIRUBIN TOTAL: 0.4 mg/dL (ref 0.2–1.2)
BUN: 16 mg/dL (ref 7–25)
CO2: 26 mmol/L (ref 20–32)
CREATININE: 0.86 mg/dL (ref 0.50–0.99)
Calcium: 9.7 mg/dL (ref 8.6–10.4)
Chloride: 99 mmol/L (ref 98–110)
Globulin: 2.8 g/dL (calc) (ref 1.9–3.7)
Glucose, Bld: 116 mg/dL — ABNORMAL HIGH (ref 65–99)
POTASSIUM: 3.8 mmol/L (ref 3.5–5.3)
SODIUM: 134 mmol/L — AB (ref 135–146)
TOTAL PROTEIN: 7.2 g/dL (ref 6.1–8.1)

## 2017-07-07 LAB — APTT: APTT: 28 s (ref 22–34)

## 2017-07-07 LAB — HEMOGLOBIN A1C
HEMOGLOBIN A1C: 6.3 %{Hb} — AB (ref <5.7)
MEAN PLASMA GLUCOSE: 134 (calc)
eAG (mmol/L): 7.4 (calc)

## 2017-07-07 LAB — PROTIME-INR
INR: 0.9
PROTHROMBIN TIME: 9.9 s (ref 9.0–11.5)

## 2017-07-09 ENCOUNTER — Telehealth: Payer: Self-pay

## 2017-07-09 ENCOUNTER — Telehealth: Payer: Self-pay | Admitting: Family Medicine

## 2017-07-09 MED ORDER — CEFAZOLIN SODIUM-DEXTROSE 2-4 GM/100ML-% IV SOLN: 2.0000 g | INTRAVENOUS | Status: DC

## 2017-07-09 NOTE — Telephone Encounter (Signed)
BP is adequately controlled. Thanks.

## 2017-07-09 NOTE — Telephone Encounter (Signed)
Copied from [place]. Topic: General - Other >> Jul 09, 2017  8:59 AM Oneta Rack wrote: Caller name: Relation to pt: Call back number: Pharmacy:  Reason for call:  BP readings  07/07/17 - 138/74 AM  07/08/17 - 133/72 AM

## 2017-07-09 NOTE — Telephone Encounter (Signed)
FYI

## 2017-07-09 NOTE — Telephone Encounter (Signed)
faxed

## 2017-07-09 NOTE — Telephone Encounter (Signed)
I spoke to you about this earlier. Wasn't sure if you had the fax number.    Copied from [place]. Topic: Inquiry >> Jul 09, 2017  9:28 AM Patrice Paradise wrote: Reason for CRM: Judeen Hammans at [facility] is requesting the surgery clearance be faxed to her @ [phone]. Patient surgery is tomorrow, she need the clearance asap.

## 2017-07-10 ENCOUNTER — Encounter: Payer: Self-pay | Admitting: *Deleted

## 2017-07-10 ENCOUNTER — Other Ambulatory Visit: Payer: Self-pay

## 2017-07-10 ENCOUNTER — Ambulatory Visit: Payer: 59 | Admitting: Anesthesiology

## 2017-07-10 ENCOUNTER — Ambulatory Visit
Admission: RE | Admit: 2017-07-10 | Discharge: 2017-07-10 | Disposition: A | Discharge status: Home / Self Care | Payer: 59 | Source: Ambulatory Visit | Attending: Orthopedic Surgery | Admitting: Orthopedic Surgery

## 2017-07-10 ENCOUNTER — Encounter
Admission: RE | Disposition: A | Discharge status: Home / Self Care | Payer: Self-pay | Source: Ambulatory Visit | Attending: Orthopedic Surgery

## 2017-07-10 DIAGNOSIS — K219 Gastro-esophageal reflux disease without esophagitis: Secondary | ICD-10-CM | POA: Insufficient documentation

## 2017-07-10 DIAGNOSIS — F329 Major depressive disorder, single episode, unspecified: Secondary | ICD-10-CM | POA: Insufficient documentation

## 2017-07-10 DIAGNOSIS — G5601 Carpal tunnel syndrome, right upper limb: Secondary | ICD-10-CM | POA: Insufficient documentation

## 2017-07-10 DIAGNOSIS — I10 Essential (primary) hypertension: Secondary | ICD-10-CM | POA: Diagnosis not present

## 2017-07-10 DIAGNOSIS — F419 Anxiety disorder, unspecified: Secondary | ICD-10-CM | POA: Insufficient documentation

## 2017-07-10 DIAGNOSIS — K449 Diaphragmatic hernia without obstruction or gangrene: Secondary | ICD-10-CM | POA: Insufficient documentation

## 2017-07-10 DIAGNOSIS — Z87891 Personal history of nicotine dependence: Secondary | ICD-10-CM | POA: Insufficient documentation

## 2017-07-10 DIAGNOSIS — Z79899 Other long term (current) drug therapy: Secondary | ICD-10-CM | POA: Diagnosis not present

## 2017-07-10 DIAGNOSIS — Z7982 Long term (current) use of aspirin: Secondary | ICD-10-CM | POA: Diagnosis not present

## 2017-07-10 HISTORY — PX: CARPAL TUNNEL RELEASE: SHX101

## 2017-07-10 SURGERY — CARPAL TUNNEL RELEASE
Anesthesia: General | Laterality: Right

## 2017-07-10 MED ORDER — FENTANYL CITRATE (PF) 100 MCG/2ML IJ SOLN
INTRAMUSCULAR | Status: AC
  Filled 2017-07-10: qty 2

## 2017-07-10 MED ORDER — ONDANSETRON HCL 4 MG PO TABS: 4.0000 mg | ORAL_TABLET | Freq: Three times a day (TID) | ORAL | 0 refills | Status: DC | PRN

## 2017-07-10 MED ORDER — MIDAZOLAM HCL 2 MG/2ML IJ SOLN
INTRAMUSCULAR | Status: AC
  Filled 2017-07-10: qty 2

## 2017-07-10 MED ORDER — GLYCOPYRROLATE 0.2 MG/ML IJ SOLN
INTRAMUSCULAR | Status: AC
  Filled 2017-07-10: qty 1

## 2017-07-10 MED ORDER — CHLORHEXIDINE GLUCONATE CLOTH 2 % EX PADS
6.0000 | MEDICATED_PAD | Freq: Once | CUTANEOUS | Status: AC
  Administered 2017-07-10: 6 via TOPICAL

## 2017-07-10 MED ORDER — MIDAZOLAM HCL 5 MG/5ML IJ SOLN
INTRAMUSCULAR | Status: DC | PRN
  Administered 2017-07-10: 2 mg via INTRAVENOUS

## 2017-07-10 MED ORDER — PHENYLEPHRINE HCL 10 MG/ML IJ SOLN
INTRAMUSCULAR | Status: DC | PRN
  Administered 2017-07-10: 100 ug via INTRAVENOUS

## 2017-07-10 MED ORDER — CHLORHEXIDINE GLUCONATE CLOTH 2 % EX PADS: 6.0000 | MEDICATED_PAD | Freq: Once | CUTANEOUS | Status: DC

## 2017-07-10 MED ORDER — ONDANSETRON HCL 4 MG/2ML IJ SOLN
INTRAMUSCULAR | Status: AC
  Filled 2017-07-10: qty 2

## 2017-07-10 MED ORDER — PROPOFOL 10 MG/ML IV BOLUS
INTRAVENOUS | Status: DC | PRN
  Administered 2017-07-10: 20 mg via INTRAVENOUS
  Administered 2017-07-10: 50 mg via INTRAVENOUS
  Administered 2017-07-10: 170 mg via INTRAVENOUS

## 2017-07-10 MED ORDER — CEFAZOLIN SODIUM-DEXTROSE 2-4 GM/100ML-% IV SOLN
INTRAVENOUS | Status: AC
  Filled 2017-07-10: qty 100

## 2017-07-10 MED ORDER — ONDANSETRON HCL 4 MG/2ML IJ SOLN
INTRAMUSCULAR | Status: DC | PRN
  Administered 2017-07-10: 4 mg via INTRAVENOUS

## 2017-07-10 MED ORDER — OXYCODONE HCL 5 MG PO TABS: 5.0000 mg | ORAL_TABLET | ORAL | 0 refills | Status: DC | PRN

## 2017-07-10 MED ORDER — LIDOCAINE HCL (CARDIAC) 20 MG/ML IV SOLN
INTRAVENOUS | Status: DC | PRN
  Administered 2017-07-10: 60 mg via INTRAVENOUS

## 2017-07-10 MED ORDER — EPHEDRINE SULFATE 50 MG/ML IJ SOLN
INTRAMUSCULAR | Status: DC | PRN
  Administered 2017-07-10: 10 mg via INTRAVENOUS

## 2017-07-10 MED ORDER — PROMETHAZINE HCL 25 MG/ML IJ SOLN: 6.2500 mg | INTRAMUSCULAR | Status: DC | PRN

## 2017-07-10 MED ORDER — DEXAMETHASONE SODIUM PHOSPHATE 10 MG/ML IJ SOLN
INTRAMUSCULAR | Status: DC | PRN
  Administered 2017-07-10: 10 mg via INTRAVENOUS

## 2017-07-10 MED ORDER — BUPIVACAINE HCL (PF) 0.25 % IJ SOLN
INTRAMUSCULAR | Status: AC
  Filled 2017-07-10: qty 30

## 2017-07-10 MED ORDER — LACTATED RINGERS IV SOLN
INTRAVENOUS | Status: DC
  Administered 2017-07-10: 10:00:00 via INTRAVENOUS

## 2017-07-10 MED ORDER — DEXAMETHASONE SODIUM PHOSPHATE 10 MG/ML IJ SOLN
INTRAMUSCULAR | Status: AC
  Filled 2017-07-10: qty 1

## 2017-07-10 MED ORDER — NEOMYCIN-POLYMYXIN B GU 40-200000 IR SOLN
Status: AC
  Filled 2017-07-10: qty 2

## 2017-07-10 MED ORDER — PROPOFOL 500 MG/50ML IV EMUL
INTRAVENOUS | Status: AC
  Filled 2017-07-10: qty 50

## 2017-07-10 MED ORDER — NEOMYCIN-POLYMYXIN B GU 40-200000 IR SOLN
Status: DC | PRN
  Administered 2017-07-10: 2 mL

## 2017-07-10 MED ORDER — OXYCODONE HCL 5 MG PO TABS
ORAL_TABLET | ORAL | Status: AC
  Administered 2017-07-10: 5 mg
  Filled 2017-07-10: qty 1

## 2017-07-10 MED ORDER — FENTANYL CITRATE (PF) 100 MCG/2ML IJ SOLN
INTRAMUSCULAR | Status: AC
  Administered 2017-07-10: 25 ug via INTRAVENOUS
  Filled 2017-07-10: qty 2

## 2017-07-10 MED ORDER — FENTANYL CITRATE (PF) 100 MCG/2ML IJ SOLN
INTRAMUSCULAR | Status: DC | PRN
  Administered 2017-07-10: 25 ug via INTRAVENOUS
  Administered 2017-07-10 (×2): 50 ug via INTRAVENOUS
  Administered 2017-07-10 (×3): 25 ug via INTRAVENOUS

## 2017-07-10 MED ORDER — FENTANYL CITRATE (PF) 100 MCG/2ML IJ SOLN
25.0000 ug | INTRAMUSCULAR | Status: DC | PRN
  Administered 2017-07-10 (×4): 25 ug via INTRAVENOUS
  Administered 2017-07-10: 50 ug via INTRAVENOUS

## 2017-07-10 SURGICAL SUPPLY — 44 items
BANDAGE ELASTIC 2 CLIP ST LF (GAUZE/BANDAGES/DRESSINGS) ×6 IMPLANT
BANDAGE ELASTIC 4 LF NS (GAUZE/BANDAGES/DRESSINGS) IMPLANT
BLADE SURG MINI STRL (BLADE) ×3 IMPLANT
BNDG ESMARK 4X12 TAN STRL LF (GAUZE/BANDAGES/DRESSINGS) ×3 IMPLANT
CANISTER SUCT 1200ML W/VALVE (MISCELLANEOUS) ×3 IMPLANT
CLOSURE WOUND 1/2 X4 (GAUZE/BANDAGES/DRESSINGS) ×1
CORD BIP STRL DISP 12FT (MISCELLANEOUS) ×3 IMPLANT
CUFF TOURN 18 STER (MISCELLANEOUS) ×3 IMPLANT
DRAPE IMP U-DRAPE 54X76 (DRAPES) ×3 IMPLANT
DRAPE SURG 17X11 SM STRL (DRAPES) ×3 IMPLANT
DURAPREP 26ML APPLICATOR (WOUND CARE) ×6 IMPLANT
ELECT REM PT RETURN 9FT ADLT (ELECTROSURGICAL) ×3
ELECTRODE REM PT RTRN 9FT ADLT (ELECTROSURGICAL) ×1 IMPLANT
FORCEPS JEWEL BIP 4-3/4 STR (INSTRUMENTS) ×3 IMPLANT
GAUZE FLUFF 18X24 1PLY STRL (GAUZE/BANDAGES/DRESSINGS) ×3 IMPLANT
GAUZE PETRO XEROFOAM 1X8 (MISCELLANEOUS) ×3 IMPLANT
GAUZE SPONGE 4X4 12PLY STRL (GAUZE/BANDAGES/DRESSINGS) ×3 IMPLANT
GLOVE BIOGEL PI IND STRL 9 (GLOVE) ×1 IMPLANT
GLOVE BIOGEL PI INDICATOR 9 (GLOVE) ×2
GLOVE SURG 9.0 ORTHO LTXF (GLOVE) ×6 IMPLANT
GOWN STRL REUS TWL 2XL XL LVL4 (GOWN DISPOSABLE) ×3 IMPLANT
GOWN STRL REUS W/ TWL LRG LVL3 (GOWN DISPOSABLE) ×1 IMPLANT
GOWN STRL REUS W/TWL LRG LVL3 (GOWN DISPOSABLE) ×2
KIT RM TURNOVER STRD PROC AR (KITS) ×3 IMPLANT
NEEDLE FILTER BLUNT 18X 1/2SAF (NEEDLE) ×2
NEEDLE FILTER BLUNT 18X1 1/2 (NEEDLE) ×1 IMPLANT
NS IRRIG 500ML POUR BTL (IV SOLUTION) ×3 IMPLANT
PACK EXTREMITY ARMC (MISCELLANEOUS) ×3 IMPLANT
PAD CAST CTTN 4X4 STRL (SOFTGOODS) ×2 IMPLANT
PADDING CAST 4IN STRL (MISCELLANEOUS) ×6
PADDING CAST BLEND 4X4 STRL (MISCELLANEOUS) ×3 IMPLANT
PADDING CAST COTTON 4X4 STRL (SOFTGOODS) ×4
SLING ARM LRG DEEP (SOFTGOODS) IMPLANT
SLING ARM M TX990204 (SOFTGOODS) ×3 IMPLANT
SPLINT CAST 1 STEP 3X12 (MISCELLANEOUS) ×3 IMPLANT
STOCKINETTE STRL 4IN 9604848 (GAUZE/BANDAGES/DRESSINGS) ×3 IMPLANT
STRIP CLOSURE SKIN 1/2X4 (GAUZE/BANDAGES/DRESSINGS) ×2 IMPLANT
SUT ETHILON 3-0 FS-10 30 BLK (SUTURE)
SUT ETHILON 4-0 (SUTURE)
SUT ETHILON 4-0 FS2 18XMFL BLK (SUTURE)
SUT ETHILON 5-0 FS-2 18 BLK (SUTURE) ×6 IMPLANT
SUTURE EHLN 3-0 FS-10 30 BLK (SUTURE) IMPLANT
SUTURE ETHLN 4-0 FS2 18XMF BLK (SUTURE) IMPLANT
SYR 3ML LL SCALE MARK (SYRINGE) ×3 IMPLANT

## 2017-07-10 NOTE — Anesthesia Procedure Notes (Signed)
Procedure Name: LMA Insertion Date/Time: 07/10/2017 11:50 AM Performed by: Dionne Bucy, CRNA Pre-anesthesia Checklist: Patient identified, Patient being monitored, Timeout performed, Emergency Drugs available and Suction available Patient Re-evaluated:Patient Re-evaluated prior to induction Oxygen Delivery Method: Circle system utilized Preoxygenation: Pre-oxygenation with 100% oxygen Induction Type: IV induction Ventilation: Mask ventilation without difficulty LMA: LMA inserted LMA Size: 4.0 Tube type: Oral Number of attempts: 1 Placement Confirmation: positive ETCO2 and breath sounds checked- equal and bilateral Tube secured with: Tape Dental Injury: Teeth and Oropharynx as per pre-operative assessment

## 2017-07-10 NOTE — Transfer of Care (Signed)
Immediate Anesthesia Transfer of Care Note  Patient: Breanna Farrell  Procedure(s) Performed: CARPAL TUNNEL RELEASE (Right )  Patient Location: PACU  Anesthesia Type:Spinal  Level of Consciousness: awake  Airway & Oxygen Therapy: Patient Spontanous Breathing and Patient connected to face mask oxygen  Post-op Assessment: Report given to RN and Post -op Vital signs reviewed and stable  Post vital signs: Reviewed and stable  Last Vitals:  Vitals:   07/10/17 1022 07/10/17 1253  BP: (!) 144/72 107/78  Pulse:  84  Resp: 18 14  Temp: 36.8 C 36.5 C  SpO2: 97% 97%    Last Pain:  Vitals:   07/10/17 1022  TempSrc: Oral  PainSc: 8          Complications: No apparent anesthesia complications

## 2017-07-10 NOTE — Op Note (Signed)
  07/10/2017  1:07 PM  PATIENT:  Breanna Farrell    PRE-OPERATIVE DIAGNOSIS:  G56.01 Carpal Tunnel Syndrome, Right upper limb  POST-OPERATIVE DIAGNOSIS:  Same  PROCEDURE:  RIGHT OPEN CARPAL TUNNEL RELEASE  SURGEON:  Thornton Park, MD  ANESTHESIA:   General  PREOPERATIVE INDICATIONS:  Breanna Farrell is a  61 y.o. female with a diagnosis of G56.01 Carpal Tunnel Syndrome, Right upper limb who failed conservative measures and elected for surgical management.    I discussed the risks and benefits of surgery. The risks include but are not limited to infection, bleeding, nerve or blood vessel injury, joint stiffness or loss of motion, persistent pain, weakness, recurrence of symptoms and the need for further surgery. Medical risks include but are not limited to DVT and pulmonary embolism, myocardial infarction, stroke, pneumonia, respiratory failure and death. Patient understood these risks and wished to proceed.   OPERATIVE FINDINGS: Significant median nerve compression at the carpal tunnel, right upper extremity  OPERATIVE PROCEDURE: Patient was met in the preoperative area. I signed the right wrist with my initials and the word yes according the hospital's correct site of surgery protocol. The pre-op H&P was performed at the bedside in pre-op. I answered all the patient's questions. She was then brought to the operating room where she underwent general anesthesia with LMA.  She was positioned supine on the operative table. The right arm was placed on a hand table. A tourniquet was applied to the right upper extremity. She was prepped and draped in a sterile fashion. A timeout was performed to verify the patient's name, date of birth, medical record number, correct site of surgery correct procedure to be performed. The time out was also used to confirm the patient received antibiotics that all necessary instruments were available in the room. The right upper extremity was then exsanguinated with  an Esmarch and the tourniquet inflated to 250 mmHg.   An incision following the palmar crease was made. This was made in line with the web space between the middle and ring fingers and the distal extent of the incision was where it intersected Kaplan's cardinal line. Bleeding vessels were cauterized with a bipolar.  The subcutaneous tissue was carefully dissected out with a Metzenbaum scissor and pickup until the palmar fascia was encountered. The distal extent of the transverse carpal ligament was then identified. A Freer elevator was placed under the transverse carpal ligament running distally to proximally. A micro-Beaver blade was then used to incise the transverse carpal ligament taking care to avoid injury to any neurovascular structures. The carpal tunnel was found to be extremely constricted. There was significant compression on the median nerve. The transverse carpal ligament was completely released. The nerve was visualized in its entirety within the carpal tunnel.   The wound was copiously irrigated. The skin was then approximated with 5-0 nylon. Xeroform was placed over the incision. A dry sterile dressing was applied along with a volar fiberglass splint. Patient was overwrapped with an Ace wrap.  A sling was placed on the right upper extremity. She was extubated and brought to the PACU in stable condition. I was scrubbed and present the entire case and all sharp and instrument counts were correct at the conclusion the case. I spoke with the patient's husband in the post-op consultation room to inform him the surgery was performed without complication and the patient was stable in the recovery room.     Breanna Gaul, MD

## 2017-07-10 NOTE — Anesthesia Post-op Follow-up Note (Signed)
Anesthesia QCDR form completed.        

## 2017-07-10 NOTE — Discharge Instructions (Signed)
  AMBULATORY SURGERY  DISCHARGE INSTRUCTIONS   1) The drugs that you were given will stay in your system until tomorrow so for the next 24 hours you should not:  A) Drive an automobile B) Make any legal decisions C) Drink any alcoholic beverage   2) You may resume regular meals tomorrow.  Today it is better to start with liquids and gradually work up to solid foods.  You may eat anything you prefer, but it is better to start with liquids, then soup and crackers, and gradually work up to solid foods.   3) Please notify your doctor immediately if you have any unusual bleeding, trouble breathing, redness and pain at the surgery site, drainage, fever, or pain not relieved by medication.    4) Additional Instructions: TAKE A STOOL SOFTENER TWICE A DAY WHILE TAKING NARCOTIC PAIN MEDICINE TO PREVENT CONSTIPATION   Please contact your physician with any problems or Same Day Surgery at [phone], Monday through Friday 6 am to 4 pm, or [facility] Main number at [phone].   

## 2017-07-10 NOTE — H&P (Signed)
PREOPERATIVE H&P  Chief Complaint: G56.01 Carpal Tunnel Syndrome, Right upper limb  HPI: Breanna Farrell is a 61 y.o. female who presents for preoperative history and physical with a diagnosis of G56.01 Carpal Tunnel Syndrome, Right upper limb. Symptoms are rated as moderate to severe, and have been worsening.  This is significantly impairing activities of daily living.  Patient has significant pain and dense paresthesias of the right thumb and index and middle fingers.  She has elected for surgical management.   Past Medical History:  Diagnosis Date  . Abnormal cervical Pap smear with positive HPV DNA test 02/01/12  . Anxiety   . Arthritis 2014   rheumatoid  . Articular cartilage disorder    bulging & herniated disc in lower back  . Benign essential hypertension   . Breast tenderness   . Bronchitis 06/05/2017  . Bulging discs 2012  . 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 Depression   . Esophageal reflux   . Family history of adverse reaction to anesthesia    MOM-N/V  . Hernia 2007   hiatal  . History of hiatal hernia    SMALL PER PT  . Hypertension 2007  . Pap smear abnormality of cervix with LGSIL 07/18/2012   BVD   Past Surgical History:  Procedure Laterality Date  . ABDOMINAL HYSTERECTOMY  1986   [name]  . BACK SURGERY  12/27/2012   [facility]  . CESAREAN SECTION  1981  . CESAREAN SECTION  1981   [facility]  . CHOLECYSTECTOMY  2007   [facility]  . COLONOSCOPY  2010  . COLPOSCOPY    . POLYPECTOMY  2007   one  . UPPER GI ENDOSCOPY  2010   Social History   Socioeconomic History  . Marital status: Married    Spouse name: None  . Number of children: None  . Years of education: None  . Highest education level: None  Social Needs  . Financial resource strain: None  . Food insecurity - worry: None  . Food insecurity - inability: None  . Transportation needs - medical: None  . Transportation needs - non-medical: None  Occupational History  . None  Tobacco  Use  . Smoking status: Former Smoker    Packs/day: 0.70    Years: 20.00    Pack years: 14.00    Types: Cigarettes    Last attempt to quit: 10/24/2008    Years since quitting: 8.7  . Smokeless tobacco: Never Used  Substance and Sexual Activity  . Alcohol use: Yes    Alcohol/week: 0.0 oz    Comment: SOCIALLY  . Drug use: No  . Sexual activity: None  Other Topics Concern  . None  Social History Narrative  . None   Family History  Problem Relation Age of Onset  . Lung cancer Maternal Grandmother        Event organiser  . Ovarian cancer Maternal Grandmother        Event organiser  . Leukemia Paternal Grandmother   . Melanoma Mother   . Hypertension Mother   . Ovarian cancer Maternal Aunt   . Heart disease Maternal Grandfather    Allergies  Allergen Reactions  . Hepatitis B Vaccine Anaphylaxis  . Metoclopramide Rash  . Lyrica [Pregabalin] Nausea And Vomiting and Rash  . Tramadol Nausea And Vomiting, Rash and Other (See Comments)    Clammy and sweaty   Prior to Admission medications   Medication Sig Start Date End Date Taking? Authorizing Provider  albuterol (  PROVENTIL HFA;VENTOLIN HFA) 108 (90 Base) MCG/ACT inhaler Inhale 1-2 puffs into the lungs every 6 (six) hours as needed for wheezing or shortness of breath. 06/06/17  Yes Luvenia Redden, PA-C  aspirin 81 MG tablet Take 81 mg by mouth daily.   Yes [provider]  Calcium Carbonate-Vit D-Min (CALCIUM 1200 PO) Take 1,200 mg by mouth daily.   Yes [provider]  cetirizine (ZYRTEC) 10 MG tablet Take 10 mg by mouth daily.   Yes [provider]  Cholecalciferol (VITAMIN D-3) 1000 units CAPS Take 2,000 Units by mouth daily.    Yes [provider]  ezetimibe (ZETIA) 10 MG tablet Take 1 tablet (10 mg total) by mouth daily. Patient taking differently: Take 10 mg by mouth every morning.  03/09/17  Yes Cook, Jayce G, DO  folic acid (FOLVITE) 678 MCG tablet Take 800 mcg by mouth daily.   Yes  [provider]  gabapentin (NEURONTIN) 300 MG capsule Take 300 mg by mouth at bedtime.    Yes [provider]  HYDROcodone-acetaminophen (NORCO/VICODIN) 5-325 MG tablet Take 1 tablet by mouth every 6 (six) hours as needed for moderate pain.   Yes [provider]  Omega-3 Fatty Acids (SM FISH OIL) 1000 MG CAPS Take 1,000 mg by mouth daily.    Yes [provider]  predniSONE (DELTASONE) 10 MG tablet Take 40 mg (4 tablets) by mouth daily for 2 days, then decrease by 1 tablet daily Patient taking differently: Take 5 mg by mouth every morning.  06/13/17  Yes [name], MD  RABEprazole (ACIPHEX) 20 MG tablet TAKE 1 TABLET BY MOUTH EVERY DAY Patient taking differently: TAKE 20 MG BY MOUTH EVERY DAY-AM 06/01/17  Yes [name], MD  Secukinumab (COSENTYX 300 DOSE) 150 MG/ML SOSY Inject 150 mg into the skin every 30 (thirty) days.    Yes [provider]  triamterene-hydrochlorothiazide (DYAZIDE) 37.5-25 MG capsule Take 1 each (1 capsule total) by mouth daily. 03/15/17  Yes Cook, Jayce G, DO     Positive ROS: All other systems have been reviewed and were otherwise negative with the exception of those mentioned in the HPI and as above.  Physical Exam: General: Alert, no acute distress Cardiovascular: Regular rate and rhythm, no murmurs rubs or gallops.  No pedal edema Respiratory: Clear to auscultation bilaterally, no wheezes rales or rhonchi. No cyanosis, no use of accessory musculature GI: No organomegaly, abdomen is soft and non-tender nondistended with positive bowel sounds. Skin: Skin intact, no lesions within the operative field. Neurologic: Sensation intact distally Psychiatric: Patient is competent for consent with normal mood and affect Lymphatic: No cervical lymphadenopathy  MUSCULOSKELETAL: Right hand.  + tinel's sign.  No thenar or hypothenar atrophy.  No muscle weakness.  Full digital ROM.  Digits well  perfused.  Assessment: G56.01 Carpal Tunnel Syndrome, Right upper limb  Plan: Plan for Procedure(s): RIGHT OPEN CARPAL TUNNEL RELEASE  I have discussed the procedure in detail and the post op course.  She was seen in the pre-op area with her husband.  I discussed the risks and benefits of surgery. Patient understood the risks include but are not limited to infection, bleeding requiring blood transfusion, nerve or blood vessel injury, joint stiffness or loss of motion, persistent pain, weakness or instability, malunion, nonunion and hardware failure and the need for further surgery. Medical risks include but are not limited to DVT and pulmonary embolism, myocardial infarction, stroke, pneumonia, respiratory failure and death. Patient understood these risks  and wished to proceed.    Thornton Park, MD   07/10/2017 11:41 AM

## 2017-07-10 NOTE — Anesthesia Preprocedure Evaluation (Signed)
Anesthesia Evaluation  Patient identified by MRN, date of birth, ID band Patient awake    Reviewed: Allergy & Precautions, H&P , NPO status , Patient's Chart, lab work & pertinent test results, reviewed documented beta blocker date and time   History of Anesthesia Complications Negative for: history of anesthetic complications  Airway Mallampati: III  TM Distance: >3 FB Neck ROM: full    Dental  (+) Caps, Dental Advidsory Given   Pulmonary neg shortness of breath, neg sleep apnea, neg COPD, Recent URI , Resolved, former smoker,           Cardiovascular Exercise Tolerance: Good hypertension, (-) angina(-) CAD, (-) Past MI, (-) Cardiac Stents and (-) CABG (-) dysrhythmias (-) Valvular Problems/Murmurs     Neuro/Psych PSYCHIATRIC DISORDERS negative neurological ROS     GI/Hepatic Neg liver ROS, hiatal hernia, GERD  ,  Endo/Other  negative endocrine ROS  Renal/GU negative Renal ROS  negative genitourinary   Musculoskeletal   Abdominal   Peds  Hematology negative hematology ROS (+)   Anesthesia Other Findings Past Medical History: 02/01/12: Abnormal cervical Pap smear with positive HPV DNA test No date: Anxiety 2014: Arthritis     Comment:  rheumatoid No date: Articular cartilage disorder     Comment:  bulging & herniated disc in lower back No date: Benign essential hypertension No date: Breast tenderness 06/05/2017: Bronchitis 2012: Bulging discs 2007: Colitis     Comment:  ulcerative No date: Depression No date: Esophageal reflux No date: Family history of adverse reaction to anesthesia     Comment:  MOM-N/V 2007: Hernia     Comment:  hiatal No date: History of hiatal hernia     Comment:  SMALL PER PT 2007: Hypertension 07/18/2012: Pap smear abnormality of cervix with LGSIL     Comment:  BVD   Reproductive/Obstetrics negative OB ROS                             Anesthesia  Physical Anesthesia Plan  ASA: III  Anesthesia Plan: General   Post-op Pain Management:    Induction: Intravenous  PONV Risk Score and Plan: 3 and Dexamethasone and Ondansetron  Airway Management Planned: LMA  Additional Equipment:   Intra-op Plan:   Post-operative Plan: Extubation in OR  Informed Consent: I have reviewed the patients History and Physical, chart, labs and discussed the procedure including the risks, benefits and alternatives for the proposed anesthesia with the patient or authorized representative who has indicated his/her understanding and acceptance.   Dental Advisory Given  Plan Discussed with: Anesthesiologist, CRNA and Surgeon  Anesthesia Plan Comments:         Anesthesia Quick Evaluation

## 2017-07-10 NOTE — Progress Notes (Signed)
[place] provided conversation and spiritual presence as the Pt waited for her procedure. Husband is bedside.    07/10/17 1100  Clinical Encounter Type  Visited With Patient;Patient and family together  Visit Type Initial;Spiritual support;Pre-op  Consult/Referral To Chaplain  Spiritual Encounters  Spiritual Needs Emotional

## 2017-07-11 NOTE — Anesthesia Postprocedure Evaluation (Signed)
Anesthesia Post Note  Patient: Breanna Farrell  Procedure(s) Performed: CARPAL TUNNEL RELEASE (Right )  Patient location during evaluation: PACU Anesthesia Type: General Level of consciousness: awake and alert Pain management: pain level controlled Vital Signs Assessment: post-procedure vital signs reviewed and stable Respiratory status: spontaneous breathing, nonlabored ventilation, respiratory function stable and patient connected to nasal cannula oxygen Cardiovascular status: blood pressure returned to baseline and stable Postop Assessment: no apparent nausea or vomiting Anesthetic complications: no     Last Vitals:  Vitals:   07/10/17 1354 07/10/17 1431  BP: 125/63 113/69  Pulse: 78 79  Resp: 12 16  Temp:    SpO2: 93%     Last Pain:  Vitals:   07/10/17 1431  TempSrc:   PainSc: 5                  Martha Clan

## 2017-07-18 ENCOUNTER — Ambulatory Visit: Payer: 59 | Admitting: Psychology

## 2017-07-18 DIAGNOSIS — G5600 Carpal tunnel syndrome, unspecified upper limb: Secondary | ICD-10-CM | POA: Insufficient documentation

## 2017-08-01 ENCOUNTER — Ambulatory Visit: Payer: 59 | Admitting: Psychology

## 2017-08-08 ENCOUNTER — Ambulatory Visit
Admission: EM | Admit: 2017-08-08 | Discharge: 2017-08-08 | Disposition: A | Discharge status: Home / Self Care | Payer: 59 | Attending: Family Medicine | Admitting: Family Medicine

## 2017-08-08 ENCOUNTER — Encounter: Payer: Self-pay | Admitting: Emergency Medicine

## 2017-08-08 ENCOUNTER — Other Ambulatory Visit: Payer: Self-pay

## 2017-08-08 DIAGNOSIS — R05 Cough: Secondary | ICD-10-CM

## 2017-08-08 DIAGNOSIS — J069 Acute upper respiratory infection, unspecified: Secondary | ICD-10-CM

## 2017-08-08 DIAGNOSIS — J0100 Acute maxillary sinusitis, unspecified: Secondary | ICD-10-CM | POA: Diagnosis not present

## 2017-08-08 DIAGNOSIS — B9789 Other viral agents as the cause of diseases classified elsewhere: Secondary | ICD-10-CM | POA: Diagnosis not present

## 2017-08-08 MED ORDER — FLUTICASONE PROPIONATE 50 MCG/ACT NA SUSP: 1.0000 | Freq: Every day | NASAL | 0 refills | Status: DC

## 2017-08-08 MED ORDER — AMOXICILLIN-POT CLAVULANATE 875-125 MG PO TABS: 1.0000 | ORAL_TABLET | Freq: Two times a day (BID) | ORAL | 0 refills | Status: DC

## 2017-08-08 MED ORDER — BENZONATATE 100 MG PO CAPS: 100.0000 mg | ORAL_CAPSULE | Freq: Three times a day (TID) | ORAL | 0 refills | Status: DC | PRN

## 2017-08-08 MED ORDER — HYDROCOD POLST-CPM POLST ER 10-8 MG/5ML PO SUER: 5.0000 mL | Freq: Every evening | ORAL | 0 refills | Status: DC | PRN

## 2017-08-08 NOTE — ED Provider Notes (Addendum)
[facility] ____________________________________________  Time seen: Approximately 1:00 PM  I have reviewed the triage vital signs and the nursing notes.   HISTORY  Chief Complaint Cough  HPI Breanna Farrell is a 62 y.o. female presenting for evaluation of 3 days of runny nose, nasal congestion, sinus pain, left ear discomfort and cough.  States cough is primarily dry hacking cough that has been interrupting her sleep. States does not feel congestion in her chest. States clear nasal drainage.  States left ear is a mild aching discomfort, no new trauma or drainage.  States her stepdaughter recently at home with some similar complaints.  Denies known fevers.  States mild throat irritation, but continues to eat and drink well.  Denies chest pain, shortness of breath, hemoptysis.  Reports his continue remain active.  States symptoms have been unresolved with over-the-counter Mucinex and decongestant agents.  Again denies known fevers.  Reports otherwise feels well. Denies recent sickness. Denies recent antibiotic use.  Reports immunocompromised second to psoriatic arthritis consentyx use. States feels similar to previous sinus infections.   [name], MD: PCP   Past Medical History:  Diagnosis Date  . Abnormal cervical Pap smear with positive HPV DNA test 02/01/12  . Anxiety   . Arthritis 2014   rheumatoid  . Articular cartilage disorder    bulging & herniated disc in lower back  . Benign essential hypertension   . Breast tenderness   . Bronchitis 06/05/2017  . Bulging discs 2012  . 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 Depression   . Esophageal reflux   . Family history of adverse reaction to anesthesia    MOM-N/V  . Hernia 2007   hiatal  . History of hiatal hernia    SMALL PER PT  . Hypertension 2007  . Pap smear abnormality of cervix with LGSIL 07/18/2012   BVD    Patient Active Problem List   Diagnosis Date Noted  . Pre-op exam 07/06/2017  . Lower  extremity edema 01/26/2017  . Anxiety and depression 01/26/2017  . GERD (gastroesophageal reflux disease) 11/07/2016  . Bronchitis 08/02/2016  . Psoriatic arthritis ([place]) 03/16/2015  . History of osteoporosis 01/06/2015  . Hyperlipidemia 01/06/2015  . Essential hypertension 01/06/2015    Past Surgical History:  Procedure Laterality Date  . ABDOMINAL HYSTERECTOMY  1986   [name]  . BACK SURGERY  12/27/2012   [facility]  . CARPAL TUNNEL RELEASE Right 07/10/2017   Procedure: CARPAL TUNNEL RELEASE;  Surgeon: Thornton Park, MD;  Location: [facility] ORS;  Service: Orthopedics;  Laterality: Right;  . CESAREAN SECTION  1981  . CESAREAN SECTION  1981   [place]  . CHOLECYSTECTOMY  2007   [place]  . COLONOSCOPY  2010  . COLPOSCOPY    . POLYPECTOMY  2007   one  . UPPER GI ENDOSCOPY  2010     No current facility-administered medications for this encounter.   Current Outpatient Medications:  .  folic acid (FOLVITE) 505 MCG tablet, Take 800 mcg by mouth daily., Disp: , Rfl:  .  gabapentin (NEURONTIN) 300 MG capsule, Take 300 mg by mouth at bedtime. , Disp: , Rfl:  .  HYDROcodone-acetaminophen (NORCO/VICODIN) 5-325 MG tablet, Take 1 tablet by mouth every 6 (six) hours as needed for moderate pain., Disp: , Rfl:  .  Omega-3 Fatty Acids (SM FISH OIL) 1000 MG CAPS, Take 1,000 mg by mouth daily. , Disp: , Rfl:  .  ondansetron (ZOFRAN) 4 MG tablet, Take 1 tablet (  4 mg total) by mouth every 8 (eight) hours as needed for nausea or vomiting., Disp: 30 tablet, Rfl: 0 .  oxyCODONE (OXY IR/ROXICODONE) 5 MG immediate release tablet, Take 1 tablet (5 mg total) by mouth every 4 (four) hours as needed for severe pain., Disp: 40 tablet, Rfl: 0 .  predniSONE (DELTASONE) 10 MG tablet, Take 40 mg (4 tablets) by mouth daily for 2 days, then decrease by 1 tablet daily (Patient taking differently: Take 5 mg by mouth every morning. ), Disp: 14 tablet, Rfl: 0 .  RABEprazole (ACIPHEX) 20 MG tablet, TAKE  1 TABLET BY MOUTH EVERY DAY (Patient taking differently: TAKE 20 MG BY MOUTH EVERY DAY-AM), Disp: 30 tablet, Rfl: 2 .  Secukinumab (COSENTYX 300 DOSE) 150 MG/ML SOSY, Inject 150 mg into the skin every 30 (thirty) days. , Disp: , Rfl:  .  triamterene-hydrochlorothiazide (DYAZIDE) 37.5-25 MG capsule, Take 1 each (1 capsule total) by mouth daily., Disp: 90 capsule, Rfl: 3 .  albuterol (PROVENTIL HFA;VENTOLIN HFA) 108 (90 Base) MCG/ACT inhaler, Inhale 1-2 puffs into the lungs every 6 (six) hours as needed for wheezing or shortness of breath., Disp: 1 Inhaler, Rfl: 0 .  amoxicillin-clavulanate (AUGMENTIN) 875-125 MG tablet, Take 1 tablet by mouth every 12 (twelve) hours., Disp: 20 tablet, Rfl: 0 .  aspirin 81 MG tablet, Take 81 mg by mouth daily., Disp: , Rfl:  .  benzonatate (TESSALON PERLES) 100 MG capsule, Take 1 capsule (100 mg total) by mouth 3 (three) times daily as needed for cough., Disp: 15 capsule, Rfl: 0 .  Calcium Carbonate-Vit D-Min (CALCIUM 1200 PO), Take 1,200 mg by mouth daily., Disp: , Rfl:  .  cetirizine (ZYRTEC) 10 MG tablet, Take 10 mg by mouth daily., Disp: , Rfl:  .  chlorpheniramine-HYDROcodone (TUSSIONEX PENNKINETIC ER) 10-8 MG/5ML SUER, Take 5 mLs by mouth at bedtime as needed for cough. do not drive or operate machinery while taking as can cause drowsiness., Disp: 75 mL, Rfl: 0 .  Cholecalciferol (VITAMIN D-3) 1000 units CAPS, Take 2,000 Units by mouth daily. , Disp: , Rfl:  .  ezetimibe (ZETIA) 10 MG tablet, Take 1 tablet (10 mg total) by mouth daily. (Patient taking differently: Take 10 mg by mouth every morning. ), Disp: 90 tablet, Rfl: 3 .  fluticasone (FLONASE) 50 MCG/ACT nasal spray, Place 1 spray into both nostrils daily., Disp: 1 g, Rfl: 0  Allergies Hepatitis b vaccine; Metoclopramide; Lyrica [pregabalin]; and Tramadol  Family History  Problem Relation Age of Onset  . Lung cancer Maternal Grandmother        Event organiser  . Ovarian cancer Maternal Grandmother          Event organiser  . Leukemia Paternal Grandmother   . Melanoma Mother   . Hypertension Mother   . Ovarian cancer Maternal Aunt   . Heart disease Maternal Grandfather     Social History Social History   Tobacco Use  . Smoking status: Former Smoker    Packs/day: 0.70    Years: 20.00    Pack years: 14.00    Types: Cigarettes    Last attempt to quit: 10/24/2008    Years since quitting: 8.7  . Smokeless tobacco: Never Used  Substance Use Topics  . Alcohol use: Yes    Alcohol/week: 0.0 oz    Comment: SOCIALLY  . Drug use: No    Review of Systems Constitutional: No fever/chills ENT: As above.  Cardiovascular: Denies chest pain. Respiratory: Denies shortness of breath. Gastrointestinal: No abdominal  pain.  No nausea, no vomiting.  No diarrhea.  No constipation. Genitourinary: Negative for dysuria. Musculoskeletal: Negative for back pain. Skin: Negative for rash.   ____________________________________________   PHYSICAL EXAM:  VITAL SIGNS: ED Triage Vitals  Enc Vitals Group     BP 08/08/17 1214 (!) 151/77     Pulse Rate 08/08/17 1214 90     Resp 08/08/17 1214 16     Temp 08/08/17 1214 98 F (36.7 C)     Temp Source 08/08/17 1214 Oral     SpO2 08/08/17 1214 99 %     Weight 08/08/17 1212 196 lb (88.9 kg)     Height 08/08/17 1212 5\' 4"  (1.626 m)     Head Circumference --      Peak Flow --      Pain Score 08/08/17 1212 3     Pain Loc --      Pain Edu? --      Excl. in [place]? --     Constitutional: Alert and oriented. Well appearing and in no acute distress. Eyes: Conjunctivae are normal. Head: Atraumatic.Mild  tenderness to palpation bilateral maxillary sinuses. No frontal sinus tenderness. No swelling. No erythema.   Ears: no erythema, normal TMs bilaterally.   Nose: nasal congestion with bilateral nasal turbinate erythema and edema.   Mouth/Throat: Mucous membranes are moist.  Oropharynx non-erythematous.No tonsillar swelling or exudate.  Neck: No  stridor.  No cervical spine tenderness to palpation. Hematological/Lymphatic/Immunilogical: No cervical lymphadenopathy. Cardiovascular: Normal rate, regular rhythm. Grossly normal heart sounds.  Good peripheral circulation. Respiratory: Normal respiratory effort.  No retractions.  No wheezes, rales or rhonchi. Good air movement.  Musculoskeletal: Steady gait.  Neurologic:  Normal speech and language. No gait instability. Skin:  Skin is warm, dry and intact. No rash noted. Psychiatric: Mood and affect are normal. Speech and behavior are normal.  ___________________________________________   LABS (all labs ordered are listed, but only abnormal results are displayed)  Labs Reviewed - No data to display   PROCEDURES Procedures   INITIAL IMPRESSION / ASSESSMENT AND PLAN / ED COURSE  Pertinent labs & imaging results that were available during my care of the patient were reviewed by me and considered in my medical decision making (see chart for details).  Well-appearing patient.  No acute distress.  Suspect recent viral upper respiratory infection with cough.  Will treat supportively with parent Ladona Ridgel, as needed Tussionex and Flonase.  Patient is immunocompromised, discussed in detail suspect viral infection, however reports if symptoms persist past 3 more days, hardcopy of Augmentin given to begin.  Discussed strict follow-up and return parameters and reevaluation for any worsening concerns.Discussed indication, risks and benefits of medications with patient.  Discussed follow up with Primary care physician this week. Discussed follow up and return parameters including no resolution or any worsening concerns. Patient verbalized understanding and agreed to plan.   ____________________________________________   FINAL CLINICAL IMPRESSION(S) / ED DIAGNOSES  Final diagnoses:  Viral URI with cough  Acute maxillary sinusitis, recurrence not specified     ED Discharge Orders         Ordered    chlorpheniramine-HYDROcodone (TUSSIONEX PENNKINETIC ER) 10-8 MG/5ML SUER  At bedtime PRN     08/08/17 1250    benzonatate (TESSALON PERLES) 100 MG capsule  3 times daily PRN     08/08/17 1250    fluticasone (FLONASE) 50 MCG/ACT nasal spray  Daily     08/08/17 1250    amoxicillin-clavulanate (AUGMENTIN) 875-125 MG tablet  Every 12 hours,   Status:  Discontinued     08/08/17 1250    amoxicillin-clavulanate (AUGMENTIN) 875-125 MG tablet  Every 12 hours     08/08/17 1258       Note: This dictation was prepared with Dragon dictation along with smaller phrase technology. Any transcriptional errors that result from this process are unintentional.         Marylene Land, NP 08/08/17 1412    Marylene Land, NP 08/08/17 1413

## 2017-08-08 NOTE — ED Triage Notes (Signed)
Patient c/o cough and congestion, and runny nose that started yesterday.  Patient denies fevers.

## 2017-08-08 NOTE — Discharge Instructions (Signed)
Take medication as prescribed. Rest. Drink plenty of fluids.  ° °Follow up with your primary care physician this week as needed. Return to Urgent care for new or worsening concerns.  ° °

## 2017-08-15 ENCOUNTER — Ambulatory Visit (INDEPENDENT_AMBULATORY_CARE_PROVIDER_SITE_OTHER): Payer: 59 | Admitting: Psychology

## 2017-08-15 DIAGNOSIS — F4323 Adjustment disorder with mixed anxiety and depressed mood: Secondary | ICD-10-CM

## 2017-08-29 ENCOUNTER — Ambulatory Visit: Payer: 59 | Admitting: Psychology

## 2017-09-14 ENCOUNTER — Ambulatory Visit (INDEPENDENT_AMBULATORY_CARE_PROVIDER_SITE_OTHER): Payer: 59 | Admitting: Family Medicine

## 2017-09-14 ENCOUNTER — Other Ambulatory Visit: Payer: Self-pay

## 2017-09-14 ENCOUNTER — Encounter: Payer: Self-pay | Admitting: Family Medicine

## 2017-09-14 VITALS — BP 134/80 | HR 93 | Temp 98.3°F | Wt 201.4 lb

## 2017-09-14 DIAGNOSIS — K219 Gastro-esophageal reflux disease without esophagitis: Secondary | ICD-10-CM | POA: Diagnosis not present

## 2017-09-14 DIAGNOSIS — Z1231 Encounter for screening mammogram for malignant neoplasm of breast: Secondary | ICD-10-CM | POA: Diagnosis not present

## 2017-09-14 DIAGNOSIS — I10 Essential (primary) hypertension: Secondary | ICD-10-CM | POA: Diagnosis not present

## 2017-09-14 DIAGNOSIS — L4050 Arthropathic psoriasis, unspecified: Secondary | ICD-10-CM | POA: Diagnosis not present

## 2017-09-14 DIAGNOSIS — R945 Abnormal results of liver function studies: Secondary | ICD-10-CM | POA: Diagnosis not present

## 2017-09-14 DIAGNOSIS — G5601 Carpal tunnel syndrome, right upper limb: Secondary | ICD-10-CM

## 2017-09-14 DIAGNOSIS — D72829 Elevated white blood cell count, unspecified: Secondary | ICD-10-CM | POA: Diagnosis not present

## 2017-09-14 DIAGNOSIS — Z1239 Encounter for other screening for malignant neoplasm of breast: Secondary | ICD-10-CM

## 2017-09-14 DIAGNOSIS — R7989 Other specified abnormal findings of blood chemistry: Secondary | ICD-10-CM | POA: Insufficient documentation

## 2017-09-14 NOTE — Assessment & Plan Note (Signed)
She will continue to follow with rheumatology 

## 2017-09-14 NOTE — Assessment & Plan Note (Signed)
Minimally elevated previously.  Suspect related to prednisone.  Patient wants to defer further evaluation of this until she is off prednisone.

## 2017-09-14 NOTE — Assessment & Plan Note (Signed)
Well-controlled.  Continue current regimen. 

## 2017-09-14 NOTE — Assessment & Plan Note (Signed)
Significantly improved after surgery.  She will continue to monitor.

## 2017-09-14 NOTE — Assessment & Plan Note (Signed)
Minimal elevation previously.  Patient is due for recheck.  She defers this currently until medication changes have been made regarding her rheumatologic medications.

## 2017-09-14 NOTE — Progress Notes (Signed)
Tommi Rumps, MD Phone: [phone]  Breanna Farrell is a 62 y.o. female who presents today for follow-up.  Patient underwent carpal tunnel surgery.  She notes this has done quite well and she is getting good motion back.  This was on the right wrist.  Numbness has almost completely gone away.  She is very happy with the results.  Hypertension: Not checking at home.  Taking triamterene/HCTZ.  No chest pain, shortness of breath, or edema.  She has been following with rheumatology for psoriatic arthritis.  They are going to change her from cosentyx to [facility].  She has been on prednisone for many years and is currently on 5 mg every other day.  She is in the process of weaning off of this.  This is likely why her WBC was elevated.  Very minimal LFT elevation as well.  Patient reports she has been on AcipHex for some time now for lower esophageal sphincter dysfunction and some reflux.  She notes no reflux symptoms though notes for the last week and a half or so when she eats it feels like something rolls at the top of her stomach.  It is not painful.  There is no chest pain or abdominal pain.  Feels like this sensation wanted to be reflux but nothing refluxed.  Social History   Tobacco Use  Smoking Status Former Smoker  . Packs/day: 0.70  . Years: 20.00  . Pack years: 14.00  . Types: Cigarettes  . Last attempt to quit: 10/24/2008  . Years since quitting: 8.8  Smokeless Tobacco Never Used     ROS see history of present illness  Objective  Physical Exam Vitals:   09/14/17 1309  BP: 134/80  Pulse: 93  Temp: 98.3 F (36.8 C)  SpO2: 96%    BP Readings from Last 3 Encounters:  09/14/17 134/80  08/08/17 (!) 151/77  07/10/17 113/69   Wt Readings from Last 3 Encounters:  09/14/17 201 lb 6.4 oz (91.4 kg)  08/08/17 196 lb (88.9 kg)  07/10/17 198 lb (89.8 kg)    Physical Exam  Constitutional: No distress.  Cardiovascular: Normal rate, regular rhythm and normal heart sounds.   Pulmonary/Chest: Effort normal and breath sounds normal.  Abdominal: Soft. Bowel sounds are normal. She exhibits no distension. There is no tenderness. There is no rebound and no guarding.  Musculoskeletal: She exhibits no edema.  Well-healed scar right wrist  Neurological: She is alert. Gait normal.  Skin: Skin is warm and dry. She is not diaphoretic.     Assessment/Plan: Please see individual problem list.  Psoriatic arthritis ([place]) She will continue to follow with rheumatology.  Essential hypertension Well-controlled.  Continue current regimen.  GERD (gastroesophageal reflux disease) Well-controlled.  Odd sensation of the top of her stomach.  Benign abdominal exam.  She will continue AcipHex.  She noted if this continued she would let us know and we can get her to see GI.  Leukocytosis Minimally elevated previously.  Suspect related to prednisone.  Patient wants to defer further evaluation of this until she is off prednisone.  Elevated LFTs Minimal elevation previously.  Patient is due for recheck.  She defers this currently until medication changes have been made regarding her rheumatologic medications.  Carpal tunnel syndrome Significantly improved after surgery.  She will continue to monitor.   Orders Placed This Encounter  Procedures  . MM SCREENING BREAST TOMO BILATERAL    Standing Status:   Future    Standing Expiration Date:   11/13/2018  Order Specific Question:   Reason for Exam (SYMPTOM  OR DIAGNOSIS REQUIRED)    Answer:   breast cancer screening    Order Specific Question:   Preferred imaging location?    Answer:   [place]    No orders of the defined types were placed in this encounter.    Tommi Rumps, MD [place]

## 2017-09-14 NOTE — Patient Instructions (Signed)
Nice to see you. Please monitor your reflux and stay on AcipHex.  If that sensation get does not improve please let us know so we can get you to see GI. We will get you scheduled.

## 2017-09-14 NOTE — Assessment & Plan Note (Signed)
Well-controlled.  Odd sensation of the top of her stomach.  Benign abdominal exam.  She will continue AcipHex.  She noted if this continued she would let us know and we can get her to see GI.

## 2017-10-09 ENCOUNTER — Ambulatory Visit
Admission: RE | Admit: 2017-10-09 | Discharge: 2017-10-09 | Disposition: A | Discharge status: Home / Self Care | Payer: 59 | Source: Ambulatory Visit | Attending: Family Medicine | Admitting: Family Medicine

## 2017-10-09 DIAGNOSIS — Z1231 Encounter for screening mammogram for malignant neoplasm of breast: Secondary | ICD-10-CM | POA: Diagnosis present

## 2017-10-09 DIAGNOSIS — R928 Other abnormal and inconclusive findings on diagnostic imaging of breast: Secondary | ICD-10-CM | POA: Insufficient documentation

## 2017-10-09 DIAGNOSIS — Z1239 Encounter for other screening for malignant neoplasm of breast: Secondary | ICD-10-CM

## 2017-10-16 ENCOUNTER — Telehealth: Payer: Self-pay

## 2017-10-16 NOTE — Telephone Encounter (Signed)
-----   Message from [name], MD sent at 10/14/2017  9:56 AM EDT ----- Regarding: MAMMO Please follow up with the breast center to see when her mammogram will be read. I assume they are waiting on prior images. Thanks. Randall Hiss.    ----- Message ----- From: SYSTEM Sent: 10/14/2017  12:05 AM To: [name], MD

## 2017-10-16 NOTE — Telephone Encounter (Signed)
Breanna Farrell is waiting on priors

## 2017-10-16 NOTE — Telephone Encounter (Signed)
Noted  

## 2017-10-23 ENCOUNTER — Other Ambulatory Visit: Payer: Self-pay | Admitting: *Deleted

## 2017-10-23 ENCOUNTER — Inpatient Hospital Stay
Admission: RE | Admit: 2017-10-23 | Discharge: 2017-10-23 | Disposition: A | Discharge status: Home / Self Care | Payer: Self-pay | Source: Ambulatory Visit | Attending: *Deleted | Admitting: *Deleted

## 2017-10-23 DIAGNOSIS — Z9289 Personal history of other medical treatment: Secondary | ICD-10-CM

## 2017-10-24 ENCOUNTER — Other Ambulatory Visit: Payer: Self-pay | Admitting: Family Medicine

## 2017-10-24 DIAGNOSIS — R921 Mammographic calcification found on diagnostic imaging of breast: Secondary | ICD-10-CM

## 2017-10-24 DIAGNOSIS — R928 Other abnormal and inconclusive findings on diagnostic imaging of breast: Secondary | ICD-10-CM

## 2017-11-02 ENCOUNTER — Ambulatory Visit
Admission: RE | Admit: 2017-11-02 | Discharge: 2017-11-02 | Disposition: A | Discharge status: Home / Self Care | Payer: 59 | Source: Ambulatory Visit | Attending: Family Medicine | Admitting: Family Medicine

## 2017-11-02 DIAGNOSIS — R921 Mammographic calcification found on diagnostic imaging of breast: Secondary | ICD-10-CM | POA: Diagnosis not present

## 2017-11-02 DIAGNOSIS — R928 Other abnormal and inconclusive findings on diagnostic imaging of breast: Secondary | ICD-10-CM

## 2017-11-05 ENCOUNTER — Other Ambulatory Visit: Payer: Self-pay | Admitting: Family Medicine

## 2017-11-05 DIAGNOSIS — R921 Mammographic calcification found on diagnostic imaging of breast: Secondary | ICD-10-CM

## 2017-11-05 DIAGNOSIS — R928 Other abnormal and inconclusive findings on diagnostic imaging of breast: Secondary | ICD-10-CM

## 2017-11-06 ENCOUNTER — Other Ambulatory Visit: Payer: Self-pay | Admitting: Family Medicine

## 2017-11-06 DIAGNOSIS — R928 Other abnormal and inconclusive findings on diagnostic imaging of breast: Secondary | ICD-10-CM

## 2017-11-06 DIAGNOSIS — R921 Mammographic calcification found on diagnostic imaging of breast: Secondary | ICD-10-CM

## 2017-11-15 ENCOUNTER — Ambulatory Visit
Admission: RE | Admit: 2017-11-15 | Discharge: 2017-11-15 | Disposition: A | Discharge status: Home / Self Care | Payer: 59 | Source: Ambulatory Visit | Attending: Family Medicine | Admitting: Family Medicine

## 2017-11-15 DIAGNOSIS — R921 Mammographic calcification found on diagnostic imaging of breast: Secondary | ICD-10-CM

## 2017-11-15 DIAGNOSIS — R928 Other abnormal and inconclusive findings on diagnostic imaging of breast: Secondary | ICD-10-CM

## 2017-11-15 HISTORY — PX: BREAST BIOPSY: SHX20

## 2017-11-19 LAB — SURGICAL PATHOLOGY

## 2018-02-13 ENCOUNTER — Telehealth: Payer: Self-pay | Admitting: Family Medicine

## 2018-02-13 DIAGNOSIS — R1013 Epigastric pain: Secondary | ICD-10-CM

## 2018-02-13 NOTE — Telephone Encounter (Signed)
Patient states she would like to have a referral for the stomach pressure, she states she had mentioned this in the past. She also states her colitis symptoms have flared up as well. She states she has occasional cramping. She states it is not bad. She would like to see a GI doctor for follow up on this. She has a history of colitis. Informed patient to be evaluated if symptoms worsen or she has new symptoms.

## 2018-02-13 NOTE — Telephone Encounter (Signed)
If patient needs to be seen first please route to [place] [facility] to contact and schedule patient.  Otherwise if referral can be placed, Janett Billow can notify patient via [place].  Copied from [place] [id number]. Topic: Referral - Request >> Feb 13, 2018  2:34 PM Conception Chancy, NT wrote: Reason for CRM: patient is requesting a referral to a GI doctor.

## 2018-02-13 NOTE — Addendum Note (Signed)
Addended by: [name] on: 02/13/2018 08:14 PM   Modules accepted: Orders

## 2018-02-13 NOTE — Telephone Encounter (Signed)
Please contact the patient and see why she needs the referral. Is she having symptoms? Thanks.

## 2018-02-13 NOTE — Telephone Encounter (Signed)
Referral placed. Please find out what kind of colitis she has had.

## 2018-02-14 NOTE — Telephone Encounter (Signed)
Noted. GI referral placed.

## 2018-02-14 NOTE — Telephone Encounter (Signed)
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

## 2018-02-18 ENCOUNTER — Other Ambulatory Visit: Payer: Self-pay | Admitting: Family Medicine

## 2018-02-19 NOTE — Telephone Encounter (Signed)
Last OV 09/14/17 last filled 06/01/17 30 2rf

## 2018-02-27 ENCOUNTER — Encounter: Payer: Self-pay | Admitting: Gastroenterology

## 2018-02-27 ENCOUNTER — Ambulatory Visit (INDEPENDENT_AMBULATORY_CARE_PROVIDER_SITE_OTHER): Payer: 59 | Admitting: Gastroenterology

## 2018-02-27 VITALS — BP 130/78 | HR 97 | Ht 64.0 in | Wt 202.4 lb

## 2018-02-27 DIAGNOSIS — R7989 Other specified abnormal findings of blood chemistry: Secondary | ICD-10-CM

## 2018-02-27 DIAGNOSIS — R945 Abnormal results of liver function studies: Secondary | ICD-10-CM | POA: Diagnosis not present

## 2018-02-27 DIAGNOSIS — M810 Age-related osteoporosis without current pathological fracture: Secondary | ICD-10-CM | POA: Insufficient documentation

## 2018-02-27 NOTE — Patient Instructions (Signed)
F/U 3 months Bed wedge  Gastroesophageal Reflux Disease, Adult Normally, food travels down the esophagus and stays in the stomach to be digested. If a person has gastroesophageal reflux disease (GERD), food and stomach acid move back up into the esophagus. When this happens, the esophagus becomes sore and swollen (inflamed). Over time, GERD can make small holes (ulcers) in the lining of the esophagus. Follow these instructions at home: Diet  Follow a diet as told by your doctor. You may need to avoid foods and drinks such as: ? Coffee and tea (with or without caffeine). ? Drinks that contain alcohol. ? Energy drinks and sports drinks. ? Carbonated drinks or sodas. ? Chocolate and cocoa. ? Peppermint and mint flavorings. ? Garlic and onions. ? Horseradish. ? Spicy and acidic foods, such as peppers, chili powder, curry powder, vinegar, hot sauces, and BBQ sauce. ? Citrus fruit juices and citrus fruits, such as oranges, lemons, and limes. ? Tomato-based foods, such as red sauce, chili, salsa, and pizza with red sauce. ? Fried and fatty foods, such as donuts, french fries, potato chips, and high-fat dressings. ? High-fat meats, such as hot dogs, rib eye steak, sausage, ham, and bacon. ? High-fat dairy items, such as whole milk, butter, and cream cheese.  Eat small meals often. Avoid eating large meals.  Avoid drinking large amounts of liquid with your meals.  Avoid eating meals during the 2-3 hours before bedtime.  Avoid lying down right after you eat.  Do not exercise right after you eat. General instructions  Pay attention to any changes in your symptoms.  Take over-the-counter and prescription medicines only as told by your doctor. Do not take aspirin, ibuprofen, or other NSAIDs unless your doctor says it is okay.  Do not use any tobacco products, including cigarettes, chewing tobacco, and e-cigarettes. If you need help quitting, ask your doctor.  Wear loose clothes. Do not  wear anything tight around your waist.  Raise (elevate) the head of your bed about 6 inches (15 cm).  Try to lower your stress. If you need help doing this, ask your doctor.  If you are overweight, lose an amount of weight that is healthy for you. Ask your doctor about a safe weight loss goal.  Keep all follow-up visits as told by your doctor. This is important. Contact a doctor if:  You have new symptoms.  You lose weight and you do not know why it is happening.  You have trouble swallowing, or it hurts to swallow.  You have wheezing or a cough that keeps happening.  Your symptoms do not get better with treatment.  You have a hoarse voice. Get help right away if:  You have pain in your arms, neck, jaw, teeth, or back.  You feel sweaty, dizzy, or light-headed.  You have chest pain or shortness of breath.  You throw up (vomit) and your throw up looks like blood or coffee grounds.  You pass out (faint).  Your poop (stool) is bloody or black.  You cannot swallow, drink, or eat. This information is not intended to replace advice given to you by your health care provider. Make sure you discuss any questions you have with your health care provider. Document Released: 01/10/2008 Document Revised: 12/30/2015 Document Reviewed: 11/18/2014 Elsevier Interactive Patient Education  [employer].

## 2018-02-27 NOTE — Progress Notes (Addendum)
Breanna Farrell [address]  [place], [place] [address]  Main: [phone]  Fax: [phone]   Gastroenterology Consultation  Referring Provider:     [name], MD Primary Care Physician:  [name], MD Primary Gastroenterologist:  Breanna Farrell Reason for Consultation:     Diarrhea        HPI:    Chief Complaint  Patient presents with  . New Patient (Initial Visit)    Bloating, Abdominal Pain, Colitis, Elevated Liver Enzymes    Breanna Farrell is a 62 y.o. y/o female referred for consultation & management  by Dr. Caryl Bis, Angela Adam, MD.  Patient with a history of chronic diarrhea.  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  We do not have the biopsy report.  Provation shows a colonoscopy done by Dr. Sonny Masters which reports a 5 mm rectal polyp that was removed.  Biopsies were obtained from the colon.  Patient states Dr. Vira Agar was following her at the time and put her on Entocort and cholestyramine.  She has not been on this for years.  States as of December 2018 she has had 7-8 loose bowel movements daily.  Prior to this was having 2-3 bowel movements daily.  No blood in stool, weight loss, nausea or vomiting.  Is on AcipHex daily, as Had severe heartburn prior to this.  Now denies any heartburn, but reports midepigastric intermittent discomfort at times.  No radiation.  Occurs 2-3 times a week.  No dysphagia.  Care everywhere also shows biopsies reports from an EGD and colonoscopy done in 2014.  Procedure report not available in chart.  A. "GASTRIC" (ENDOSCOPIC BIOPSY):   GASTRIC MUCOSA WITH MILD CHRONIC GASTRITIS.  NO ACTIVE GASTRITIS IS SEEN.  IMMUNOHISTOCHEMISTRY FOR HELICOBACTER PYLORI IS NEGATIVE.  SMALL FUNDIC GLAND POLYP.  NO DYSPLASIA OR CARCINOMA IS SEEN.   B. "COLON" (ENDOSCOPIC BIOPSY):   COLONIC MUCOSA WITH NO PATHOLOGIC DIAGNOSIS.  NO ACTIVE OR CHRONIC COLITIS IS  SEEN.  NO EVIDENCE OF LYMPHOCYTIC OR COLLAGENOUS COLITIS IS SEEN.   C. "TERMINAL ILEUM" (ENDOSCOPIC BIOPSY):   SMALL BOWEL MUCOSA WITH REACTIVE LYMPHOID HYPERPLASIA.  NO ACTIVE OR CHRONIC ENTERITIS IS SEEN.  NEGATIVE FOR DYSPLASIA OR MALIGNANCY.   D. "ESOPHAGUS" (ENDOSCOPIC BIOPSY):   SQUAMOUS EPITHELIUM WITH NO PATHOLOGIC DIAGNOSIS.  NO EVIDENCE OF REFLUX OR EOSINOPHILIC ESOPHAGITIS IS SEEN.  There is also a barium swallow available under imaging in 2010.  This reports hiatal hernia, and otherwise normal esophagus.  She denies any previous history of diagnosis of achalasia, but states she was diagnosed with a loose lower esophageal sphincter in the past.  Past Medical History:  Diagnosis Date  . Abnormal cervical Pap smear with positive HPV DNA test 02/01/12  . Anxiety   . Arthritis 2014   rheumatoid  . Articular cartilage disorder    bulging & herniated disc in lower back  . Benign essential hypertension   . Breast tenderness   . Bronchitis 06/05/2017  . Bulging discs 2012  . 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 Depression   . Esophageal reflux   . Family history of adverse reaction to anesthesia    MOM-N/V  . Hernia 2007   hiatal  . History of hiatal hernia    SMALL PER PT  . Hypertension 2007  . Pap smear abnormality of cervix with LGSIL 07/18/2012   BVD    Past Surgical History:  Procedure Laterality Date  . ABDOMINAL HYSTERECTOMY  [place]  . BACK SURGERY  12/27/2012   [facility]  . BREAST BIOPSY Left 11/15/2017   x shape marker path pending UOQ  . BREAST BIOPSY Left 11/15/2017   coil shape marker path pending LOQ  . CARPAL TUNNEL RELEASE Right 07/10/2017   Procedure: CARPAL TUNNEL RELEASE;  Surgeon: Thornton Park, MD;  Location: [facility] ORS;  Service: Orthopedics;  Laterality: Right;  . CESAREAN SECTION  1981  . CESAREAN SECTION  1981   [place]  . CHOLECYSTECTOMY  2007   [place]  . COLONOSCOPY  2010  .  COLPOSCOPY    . POLYPECTOMY  2007   one  . UPPER GI ENDOSCOPY  2010    Prior to Admission medications   Medication Sig Start Date End Date Taking? Authorizing Provider  aspirin 81 MG tablet Take 81 mg by mouth daily.    [provider]  Calcium Carbonate-Vit D-Min (CALCIUM 1200 PO) Take 1,200 mg by mouth daily.    [provider]  cetirizine (ZYRTEC) 10 MG tablet Take 10 mg by mouth daily.    [provider]  Cholecalciferol (VITAMIN D-3) 1000 units CAPS Take 2,000 Units by mouth daily.     [provider]  clobetasol cream (TEMOVATE) 0.05 %  01/17/18   [provider]  ezetimibe (ZETIA) 10 MG tablet Take 1 tablet (10 mg total) by mouth daily. Patient taking differently: Take 10 mg by mouth every morning.  03/09/17   Coral Spikes, DO  fluticasone (FLONASE) 50 MCG/ACT nasal spray Place 1 spray into both nostrils daily. 08/08/17   Marylene Land, NP  folic acid (FOLVITE) 518 MCG tablet Take 800 mcg by mouth daily.    [provider]  Omega-3 Fatty Acids (SM FISH OIL) 1000 MG CAPS Take 1,000 mg by mouth daily.     [provider]  ORENCIA 250 MG injection  02/19/18   [provider]  predniSONE (DELTASONE) 5 MG tablet  02/25/18   [provider]  RABEprazole (ACIPHEX) 20 MG tablet TAKE 20 MG BY MOUTH EVERY DAY-AM 02/21/18   [name], MD  Secukinumab (COSENTYX 300 DOSE) 150 MG/ML SOSY Inject 150 mg into the skin every 30 (thirty) days.     [provider]  triamterene-hydrochlorothiazide (DYAZIDE) 37.5-25 MG capsule Take 1 each (1 capsule total) by mouth daily. 03/15/17   Coral Spikes, DO    Family History  Problem Relation Age of Onset  . Lung cancer Maternal Grandmother        Event organiser  . Ovarian cancer Maternal Grandmother        Event organiser  . Leukemia Paternal Grandmother   . Melanoma Mother   . Hypertension Mother   . Ovarian cancer Maternal Aunt   . Heart disease Maternal  Grandfather   . Breast cancer Neg Hx      Social History   Tobacco Use  . Smoking status: Former Smoker    Packs/day: 0.70    Years: 20.00    Pack years: 14.00    Types: Cigarettes    Last attempt to quit: 10/24/2008    Years since quitting: 9.3  . Smokeless tobacco: Never Used  Substance Use Topics  . Alcohol use: Yes    Alcohol/week: 0.0 oz    Comment: SOCIALLY  . Drug use: No    Allergies as of 02/27/2018 - Review Complete 02/27/2018  Allergen Reaction Noted  . Hepatitis b vaccine Anaphylaxis 10/24/2012  . Metoclopramide Rash  03/16/2015  . Lyrica [pregabalin] Nausea And Vomiting and Rash 10/24/2012  . Tramadol Nausea And Vomiting, Rash, and Other (See Comments) 10/24/2012    Review of Systems:    All systems reviewed and negative except where noted in HPI.   Physical Exam:  BP 130/78   Pulse 97   Ht _0  (1.626 m)   Wt 202 lb 6.4 oz (91.8 kg)   BMI 34.74 kg/m  No LMP recorded. Patient has had a hysterectomy. Psych:  Alert and cooperative. Normal mood and affect. General:   Alert,  Well-developed, well-nourished, pleasant and cooperative in NAD Head:  Normocephalic and atraumatic. Eyes:  Sclera clear, no icterus.   Conjunctiva pink. Ears:  Normal auditory acuity. Nose:  No deformity, discharge, or lesions. Mouth:  No deformity or lesions,oropharynx pink & moist. Neck:  Supple; no masses or thyromegaly. Lungs:  Respirations even and unlabored.  Clear throughout to auscultation.   No wheezes, crackles, or rhonchi. No acute distress. Heart:  Regular rate and rhythm; no murmurs, clicks, rubs, or gallops. Abdomen:  Normal bowel sounds.  No bruits.  Soft, non-tender and non-distended without masses, hepatosplenomegaly or hernias noted.  No guarding or rebound tenderness.    Msk:  Symmetrical without gross deformities. Good, equal movement & strength bilaterally. Pulses:  Normal pulses noted. Extremities:  No clubbing or edema.  No cyanosis. Neurologic:  Alert and  oriented x3;  grossly normal neurologically. Skin:  Intact without significant lesions or rashes. No jaundice. Lymph Nodes:  No significant cervical adenopathy. Psych:  Alert and cooperative. Normal mood and affect.   Labs: CBC    Component Value Date/Time   WBC 11.4 (H) 07/06/2017 1544   RBC 4.49 07/06/2017 1544   HGB 13.9 07/06/2017 1544   HCT 39.2 07/06/2017 1544   PLT 339 07/06/2017 1544   MCV 87.3 07/06/2017 1544   MCH 31.0 07/06/2017 1544   MCHC 35.5 07/06/2017 1544   RDW 13.2 07/06/2017 1544   LYMPHSABS 2,861 07/06/2017 1544   MONOABS 0.7 01/19/2009 1422   EOSABS 114 07/06/2017 1544   BASOSABS 68 07/06/2017 1544   CMP     Component Value Date/Time   NA 134 (L) 07/06/2017 1544   NA 140 02/18/2015 1609   K 3.8 07/06/2017 1544   CL 99 07/06/2017 1544   CO2 26 07/06/2017 1544   GLUCOSE 116 (H) 07/06/2017 1544   BUN 16 07/06/2017 1544   BUN 13 02/18/2015 1609   CREATININE 0.86 07/06/2017 1544   CALCIUM 9.7 07/06/2017 1544   PROT 7.2 07/06/2017 1544   ALBUMIN 4.4 01/26/2017 1027   AST 28 07/06/2017 1544   ALT 34 (H) 07/06/2017 1544   ALKPHOS 55 01/26/2017 1027   BILITOT 0.4 07/06/2017 1544   GFRNONAA >60 08/23/2009 1848   GFRAA  08/23/2009 1848    >60        The eGFR has been calculated using the MDRD equation. This calculation has not been validated in all clinical situations. eGFR's persistently <60 mL/min signify possible Chronic Kidney Disease.    Imaging Studies: No results found.  Assessment and Plan:   ALITHEA LAPAGE is a 62 y.o. y/o female has been referred for chronic diarrhea, with self-reported history of microscopic colitis in 2007, acid reflux, hiatal hernia  We will try to obtain biopsy report from 2007 Diarrhea is chronic, no indication for colonoscopy at this time  We will obtain stool testing to rule out infection, fecal calprotectin for inflammation, and pancreatic elastase as well  If these are negative, can try bulking agent  such as Metamucil Biopsies from 2014 did not show any evidence of microscopic colitis  However, patient has rheumatoid and psoriatic arthritis and is on immunosuppressants, including daily prednisone which can affect biopsy results as well  I do not have her July 2019 labs, but she states her rheumatologist did blood work and 1 of her transaminases was in the 70s, but she was recently started on Orencia.  Her CT abdomen in 2010 showed fatty liver.  November 2018 lab work showed mildly elevated ALT at 34.  She may have underlying fatty liver.  Will repeat CMP at this time, and obtain right upper quadrant ultrasound as well  We will also check H. pylori serology due to midepigastric discomfort intermittently  If symptoms do not improve, patient was asked to call us, and we can consider endoscopic evaluation if needed at that time   Dr Breanna Farrell  Previous lab work records obtained, and last labs were done by her rheumatologist in June 2019, which showed ALT 73, AST 51 March 2019 ALT 48, AST 40 October 2018 ALT 34 AST 28 February 2017 ALT 28, AST 27  Normal bilirubin and alk phos throughout  July 2019 Hep B surface antigen negative, hep B core total antibody negative, Hep B surface antibody negative, hep C antibody negative Hemoglobin 15.2, MCV 89.3 Platelets 284

## 2018-03-03 ENCOUNTER — Other Ambulatory Visit: Payer: Self-pay | Admitting: Family Medicine

## 2018-03-04 NOTE — Telephone Encounter (Signed)
Last filled 12/05/17 Previously rx'd by Dr Lacinda Axon Last office visit 09/14/17 Next office visit 03/26/18

## 2018-03-05 ENCOUNTER — Other Ambulatory Visit
Admission: RE | Admit: 2018-03-05 | Discharge: 2018-03-05 | Disposition: A | Discharge status: Home / Self Care | Payer: 59 | Source: Ambulatory Visit | Attending: Gastroenterology | Admitting: Gastroenterology

## 2018-03-05 DIAGNOSIS — R945 Abnormal results of liver function studies: Secondary | ICD-10-CM | POA: Diagnosis not present

## 2018-03-05 DIAGNOSIS — R7989 Other specified abnormal findings of blood chemistry: Secondary | ICD-10-CM

## 2018-03-05 LAB — COMPREHENSIVE METABOLIC PANEL
ALT: 93 U/L — AB (ref 0–44)
AST: 71 U/L — AB (ref 15–41)
Albumin: 4.5 g/dL (ref 3.5–5.0)
Alkaline Phosphatase: 74 U/L (ref 38–126)
Anion gap: 8 (ref 5–15)
BUN: 9 mg/dL (ref 8–23)
CHLORIDE: 97 mmol/L — AB (ref 98–111)
CO2: 30 mmol/L (ref 22–32)
CREATININE: 0.66 mg/dL (ref 0.44–1.00)
Calcium: 9.1 mg/dL (ref 8.9–10.3)
GFR calc Af Amer: >60 mL/min (ref >60)
GFR calc non Af Amer: >60 mL/min (ref >60)
Glucose, Bld: 140 mg/dL — ABNORMAL HIGH (ref 70–99)
Potassium: 3.8 mmol/L (ref 3.5–5.1)
SODIUM: 135 mmol/L (ref 135–145)
Total Bilirubin: 0.6 mg/dL (ref 0.3–1.2)
Total Protein: 7.5 g/dL (ref 6.5–8.1)

## 2018-03-05 LAB — GASTROINTESTINAL PANEL BY PCR, STOOL (REPLACES STOOL CULTURE)
ADENOVIRUS F40/41: NOT DETECTED
ASTROVIRUS: NOT DETECTED
CYCLOSPORA CAYETANENSIS: NOT DETECTED
Campylobacter species: NOT DETECTED
Cryptosporidium: NOT DETECTED
ENTAMOEBA HISTOLYTICA: NOT DETECTED
ENTEROAGGREGATIVE E COLI (EAEC): NOT DETECTED
ENTEROTOXIGENIC E COLI (ETEC): NOT DETECTED
Enteropathogenic E coli (EPEC): NOT DETECTED
Giardia lamblia: NOT DETECTED
Norovirus GI/GII: NOT DETECTED
Plesimonas shigelloides: NOT DETECTED
Rotavirus A: NOT DETECTED
SALMONELLA SPECIES: NOT DETECTED
Sapovirus (I, II, IV, and V): NOT DETECTED
Shiga like toxin producing E coli (STEC): NOT DETECTED
Shigella/Enteroinvasive E coli (EIEC): NOT DETECTED
VIBRIO CHOLERAE: NOT DETECTED
Vibrio species: NOT DETECTED
Yersinia enterocolitica: NOT DETECTED

## 2018-03-05 LAB — C DIFFICILE QUICK SCREEN W PCR REFLEX
C DIFFICILE (CDIFF) TOXIN: NEGATIVE
C DIFFICLE (CDIFF) ANTIGEN: NEGATIVE
C Diff interpretation: NOT DETECTED

## 2018-03-06 LAB — H PYLORI, IGM, IGG, IGA AB
H Pylori IgG: <0.8 Index Value (ref 0.00–0.79)
H. Pylogi, Iga Abs: <9 units (ref 0.0–8.9)
H. Pylogi, Igm Abs: <9 units (ref 0.0–8.9)

## 2018-03-06 LAB — CALPROTECTIN, FECAL: Calprotectin, Fecal: <16 ug/g (ref 0–120)

## 2018-03-07 LAB — PANCREATIC ELASTASE, FECAL

## 2018-03-14 ENCOUNTER — Ambulatory Visit: Payer: Self-pay | Admitting: Family Medicine

## 2018-03-18 ENCOUNTER — Ambulatory Visit
Admission: RE | Admit: 2018-03-18 | Discharge: 2018-03-18 | Disposition: A | Discharge status: Home / Self Care | Payer: 59 | Source: Ambulatory Visit | Attending: Gastroenterology | Admitting: Gastroenterology

## 2018-03-18 DIAGNOSIS — R945 Abnormal results of liver function studies: Secondary | ICD-10-CM | POA: Diagnosis not present

## 2018-03-18 DIAGNOSIS — K760 Fatty (change of) liver, not elsewhere classified: Secondary | ICD-10-CM | POA: Diagnosis not present

## 2018-03-18 DIAGNOSIS — R7989 Other specified abnormal findings of blood chemistry: Secondary | ICD-10-CM

## 2018-03-19 ENCOUNTER — Encounter: Payer: Self-pay | Admitting: Rheumatology

## 2018-03-19 NOTE — Addendum Note (Signed)
Addended by: [name] on: 03/19/2018 05:31 PM   Modules accepted: Orders

## 2018-03-21 ENCOUNTER — Telehealth: Payer: Self-pay | Admitting: Gastroenterology

## 2018-03-21 NOTE — Telephone Encounter (Signed)
Pt left  vm for Dr. Bonna Gains she states she has questions about her abnormal Lab work and u/s she has done 03/18/18, she also has a question about her mother's diagnosis that is a  hereditary Liver auto amune disease

## 2018-03-26 ENCOUNTER — Ambulatory Visit (INDEPENDENT_AMBULATORY_CARE_PROVIDER_SITE_OTHER): Payer: 59 | Admitting: Family Medicine

## 2018-03-26 ENCOUNTER — Encounter: Payer: Self-pay | Admitting: Family Medicine

## 2018-03-26 DIAGNOSIS — F329 Major depressive disorder, single episode, unspecified: Secondary | ICD-10-CM

## 2018-03-26 DIAGNOSIS — R1013 Epigastric pain: Secondary | ICD-10-CM | POA: Insufficient documentation

## 2018-03-26 DIAGNOSIS — R7989 Other specified abnormal findings of blood chemistry: Secondary | ICD-10-CM

## 2018-03-26 DIAGNOSIS — F32A Depression, unspecified: Secondary | ICD-10-CM

## 2018-03-26 DIAGNOSIS — I10 Essential (primary) hypertension: Secondary | ICD-10-CM

## 2018-03-26 DIAGNOSIS — F419 Anxiety disorder, unspecified: Secondary | ICD-10-CM | POA: Diagnosis not present

## 2018-03-26 DIAGNOSIS — R945 Abnormal results of liver function studies: Secondary | ICD-10-CM | POA: Diagnosis not present

## 2018-03-26 NOTE — Assessment & Plan Note (Addendum)
Patient has had chronic intermittent epigastric discomfort.  She is following with GI.  She may need an EGD.  She has labs already ordered for further evaluation.  We discussed the possibility of a CT scan of her abdomen, though I would like to get GIs input first as she may be able to have an EGD to diagnose the cause of her symptoms.

## 2018-03-26 NOTE — Progress Notes (Signed)
Tommi Rumps, MD Phone: [phone]  Breanna Farrell is a 62 y.o. female who presents today for f/u.  CC: htn, abdominal discomfort, depression  HYPERTENSION  Disease Monitoring  Home BP Monitoring 1302/82 Chest pain- no    Dyspnea- no Medications  Compliance-  Taking dyazide.  Edema- no  Abdominal discomfort: Patient has seen GI.  Her LFTs had been slightly elevated.  She has had some chronic intermittent upper epigastric soreness.  She notes no reflux symptoms.  No blood in her stool.  No dysphagia.  Notes occasionally her epigastric area will feel like it gets hard.  She does not have much of an appetite.  Does note some early satiety.  That started in May.  She has had lab work and an ultrasound through GI.  This revealed fatty liver though otherwise relatively negative.  They have ordered additional lab work.  The patient notes her mother was just diagnosed with autoimmune liver disease.  She does note her weight is down 7 pounds.  Depression: Patient notes she might be a little depressed.  She has been very upset about her mother's diagnosis and the possible implications for the patient.  She is trying to be positive.  No SI.  Previously was seeing a therapist though no longer.    Social History   Tobacco Use  Smoking Status Former Smoker  . Packs/day: 0.70  . Years: 20.00  . Pack years: 14.00  . Types: Cigarettes  . Last attempt to quit: 10/24/2008  . Years since quitting: 9.4  Smokeless Tobacco Never Used     ROS see history of present illness  Objective  Physical Exam Vitals:   03/26/18 0926 03/26/18 1000  BP: (!) 150/100 130/90  Pulse: 77   Temp: 97.9 F (36.6 C)   SpO2: 95%     BP Readings from Last 3 Encounters:  03/26/18 130/90  02/27/18 130/78  09/14/17 134/80   Wt Readings from Last 3 Encounters:  03/26/18 197 lb 9.6 oz (89.6 kg)  02/27/18 202 lb 6.4 oz (91.8 kg)  09/14/17 201 lb 6.4 oz (91.4 kg)    Physical Exam  Constitutional: No  distress.  Cardiovascular: Normal rate, regular rhythm and normal heart sounds.  Pulmonary/Chest: Effort normal and breath sounds normal.  Abdominal: Soft. Bowel sounds are normal. She exhibits no distension. There is no tenderness.  Musculoskeletal: She exhibits no edema.  Neurological: She is alert.  Skin: Skin is warm and dry. She is not diaphoretic.  Psychiatric:  Patient intermittently tears up     Assessment/Plan: Please see individual problem list.  Essential hypertension Above goal today.  Has been well controlled though.  Suspect related to stress.  We will have her return in 2 weeks for repeat.  Elevated LFTs She needs to continue evaluation through GI.  We will message them so that they can contact the patient to get her set up for this.  Abdominal discomfort, epigastric Patient has had chronic intermittent epigastric discomfort.  She is following with GI.  She may need an EGD.  She has labs already ordered for further evaluation.  We discussed the possibility of a CT scan of her abdomen, though I would like to get GIs input first as she may be able to have an EGD to diagnose the cause of her symptoms.   Anxiety and depression Patient does note some possible depression.  She is quite upset about her mother's diagnosis and possible implications for her.  I suspect this is somewhat reactionary  to what is going on and may improve on its own.  She will monitor and if not improving let us know.  Given return precautions.  Patient was advised to contact our office on Thursday or Friday of this week if she did not hear anything regarding GI.  No orders of the defined types were placed in this encounter.   No orders of the defined types were placed in this encounter.    Tommi Rumps, MD [facility]

## 2018-03-26 NOTE — Assessment & Plan Note (Signed)
Above goal today.  Has been well controlled though.  Suspect related to stress.  We will have her return in 2 weeks for repeat.

## 2018-03-26 NOTE — Assessment & Plan Note (Addendum)
Patient does note some possible depression though her PHQ 2 score 0.  She is quite upset about her mother's diagnosis and possible implications for her.  I suspect this is somewhat reactionary to what is going on and may improve on its own.  She will monitor and if not improving let us know.  Given return precautions.

## 2018-03-26 NOTE — Assessment & Plan Note (Signed)
She needs to continue evaluation through GI.  We will message them so that they can contact the patient to get her set up for this.

## 2018-03-26 NOTE — Patient Instructions (Signed)
Nice to see you. We will have you return in 2 weeks for blood pressure check. We will send a message to your GI physician so that you can have your work-up completed. If you do not hear anything by Thursday or Friday of this week please call us.

## 2018-03-28 NOTE — Telephone Encounter (Signed)
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 She did speak to Dr. Caryl Bis about this but wanted to make sure you knew. She is losing weight. Also if you thought she needed an EGD she would like to proceed with this. She is moving in November and would like to get everything done before that.

## 2018-03-29 ENCOUNTER — Telehealth: Payer: Self-pay | Admitting: Family Medicine

## 2018-03-29 ENCOUNTER — Telehealth: Payer: Self-pay

## 2018-03-29 ENCOUNTER — Other Ambulatory Visit: Payer: Self-pay

## 2018-03-29 DIAGNOSIS — K52839 Microscopic colitis, unspecified: Secondary | ICD-10-CM

## 2018-03-29 DIAGNOSIS — G8929 Other chronic pain: Secondary | ICD-10-CM

## 2018-03-29 DIAGNOSIS — R1013 Epigastric pain: Principal | ICD-10-CM

## 2018-03-29 NOTE — Telephone Encounter (Signed)
-----   Message from Virgel Manifold, MD sent at 03/27/2018  9:09 AM EDT ----- Hi Dr. Caryl Bis,  She should be able to go to the lab and have this done at any time. Her chronic abdominal pain may be functional, but since it is still present despite aciphex I can schedule her for an EGD and also a colonoscopy to repeat biopsies for microscopic colitis. I will have my office contact her. Thank you.  Jackelyn Poling can you call her and set this up please. Thank you.   ----- Message ----- From: [name], MD Sent: 03/26/2018   3:45 PM EDT To: Virgel Manifold, MD  Hi Dr Bonna Gains,   I saw Breanna Farrell in the office for follow-up today and she was wondering what the next step would be regarding her work up for her epigastric abdominal discomfort. It looks like she had some lab work and a RUQ [place] and you have ordered additional labs and mentioned possibly completing an endoscopy previously. She wanted to see when she should have the labs completed. She also noted some early satiety today and noted a small amount (7 lbs) of weight loss without trying. Based on this and her chronic abdominal discomfort I wanted to see if you thought proceeding with an EGD would be a good idea. I also wanted to get your thoughts on obtaining a CT of her abdomen given her chronic discomfort. Would this be a worthwhile endeavor or would it be best to proceed with lab work and possibly an endoscopy first? Thanks for your help and let me know if you have any questions.   Randall Hiss

## 2018-03-29 NOTE — Progress Notes (Signed)
Pt has been scheduled for EGD/Colonoscopy on 04/11/2018 at [facility].

## 2018-03-29 NOTE — Telephone Encounter (Signed)
Please let the patient know that I heard from her GI physician.  They noted she should be able to go to the lab and have her lab work that has been ordered done at any time.  They also noted that they would contact her to get her set up for evaluation for her continued symptoms.  If she does not hear from them she should call their office.  Thanks.

## 2018-03-29 NOTE — Telephone Encounter (Signed)
I spoke with pt and scheduled her EGD/Colonoscopy for 04/11/2018. Went over prep instructions and sent via My Chart. Will send in suprep prescription to pharmacy.

## 2018-03-29 NOTE — Telephone Encounter (Signed)
Patient notified

## 2018-04-02 ENCOUNTER — Other Ambulatory Visit
Admission: RE | Admit: 2018-04-02 | Discharge: 2018-04-02 | Disposition: A | Discharge status: Home / Self Care | Payer: 59 | Source: Ambulatory Visit | Attending: Gastroenterology | Admitting: Gastroenterology

## 2018-04-02 DIAGNOSIS — R7989 Other specified abnormal findings of blood chemistry: Secondary | ICD-10-CM

## 2018-04-02 DIAGNOSIS — R945 Abnormal results of liver function studies: Secondary | ICD-10-CM | POA: Insufficient documentation

## 2018-04-02 LAB — PROTIME-INR
INR: 0.88
PROTHROMBIN TIME: 11.9 s (ref 11.4–15.2)

## 2018-04-02 LAB — FERRITIN: FERRITIN: 62 ng/mL (ref 11–307)

## 2018-04-03 LAB — CERULOPLASMIN: Ceruloplasmin: 32.1 mg/dL (ref 19.0–39.0)

## 2018-04-03 LAB — IGG 4: IgG, Subclass 4: 17 mg/dL (ref 2–96)

## 2018-04-03 LAB — HEPATITIS B E ANTIGEN: HEP B E AG: NEGATIVE

## 2018-04-03 LAB — HEPATITIS B CORE ANTIBODY, IGM: Hep B C IgM: NEGATIVE

## 2018-04-03 LAB — HEPATITIS A ANTIBODY, TOTAL: Hep A Total Ab: NEGATIVE

## 2018-04-04 ENCOUNTER — Telehealth: Payer: Self-pay

## 2018-04-04 LAB — HEPATITIS B DNA, ULTRAQUANTITATIVE, PCR
HBV DNA SERPL PCR-ACNC: NOT DETECTED IU/mL
HBV DNA SERPL PCR-LOG IU: UNDETERMINED {Log_IU}/mL

## 2018-04-04 LAB — MITOCHONDRIAL/SMOOTH MUSCLE AB PNL

## 2018-04-04 LAB — HEPATITIS B E ANTIBODY: HEP B E AB: NEGATIVE

## 2018-04-04 LAB — HCV RNA QUANT: HCV Quantitative: NOT DETECTED IU/mL (ref >50)

## 2018-04-04 NOTE — Telephone Encounter (Signed)
[facility] calls and states that [facility] could not get the Mitrochondrial/smooth due to a problem with blood. This was not done.

## 2018-04-09 ENCOUNTER — Telehealth: Payer: Self-pay | Admitting: Gastroenterology

## 2018-04-09 MED ORDER — NA SULFATE-K SULFATE-MG SULF 17.5-3.13-1.6 GM/177ML PO SOLN: 1.0000 | Freq: Once | ORAL | 0 refills | Status: AC

## 2018-04-09 NOTE — Telephone Encounter (Signed)
Patient states suprep is not at [facility] pharmacy for her colon this Thursday 9.5.19 and would like it recalled in and a call when this has been completed.

## 2018-04-09 NOTE — Telephone Encounter (Signed)
I spoke with pt and she made appt over phone so rx was not given to her. Will send to pharmacy.

## 2018-04-10 ENCOUNTER — Ambulatory Visit: Payer: Self-pay

## 2018-04-11 ENCOUNTER — Encounter: Payer: Self-pay | Admitting: *Deleted

## 2018-04-11 ENCOUNTER — Ambulatory Visit
Admission: RE | Admit: 2018-04-11 | Discharge: 2018-04-11 | Disposition: A | Discharge status: Home / Self Care | Payer: 59 | Source: Ambulatory Visit | Attending: Gastroenterology | Admitting: Gastroenterology

## 2018-04-11 ENCOUNTER — Encounter
Admission: RE | Disposition: A | Discharge status: Home / Self Care | Payer: Self-pay | Source: Ambulatory Visit | Attending: Gastroenterology

## 2018-04-11 ENCOUNTER — Ambulatory Visit: Payer: Self-pay

## 2018-04-11 ENCOUNTER — Ambulatory Visit: Payer: 59 | Admitting: Anesthesiology

## 2018-04-11 DIAGNOSIS — K644 Residual hemorrhoidal skin tags: Secondary | ICD-10-CM

## 2018-04-11 DIAGNOSIS — M069 Rheumatoid arthritis, unspecified: Secondary | ICD-10-CM | POA: Diagnosis not present

## 2018-04-11 DIAGNOSIS — Z87891 Personal history of nicotine dependence: Secondary | ICD-10-CM | POA: Insufficient documentation

## 2018-04-11 DIAGNOSIS — D124 Benign neoplasm of descending colon: Secondary | ICD-10-CM | POA: Diagnosis not present

## 2018-04-11 DIAGNOSIS — R1013 Epigastric pain: Secondary | ICD-10-CM | POA: Diagnosis not present

## 2018-04-11 DIAGNOSIS — E119 Type 2 diabetes mellitus without complications: Secondary | ICD-10-CM | POA: Diagnosis not present

## 2018-04-11 DIAGNOSIS — K3189 Other diseases of stomach and duodenum: Secondary | ICD-10-CM | POA: Insufficient documentation

## 2018-04-11 DIAGNOSIS — K219 Gastro-esophageal reflux disease without esophagitis: Secondary | ICD-10-CM | POA: Insufficient documentation

## 2018-04-11 DIAGNOSIS — K621 Rectal polyp: Secondary | ICD-10-CM

## 2018-04-11 DIAGNOSIS — D120 Benign neoplasm of cecum: Secondary | ICD-10-CM

## 2018-04-11 DIAGNOSIS — K635 Polyp of colon: Secondary | ICD-10-CM

## 2018-04-11 DIAGNOSIS — Z7952 Long term (current) use of systemic steroids: Secondary | ICD-10-CM | POA: Diagnosis not present

## 2018-04-11 DIAGNOSIS — K5520 Angiodysplasia of colon without hemorrhage: Secondary | ICD-10-CM

## 2018-04-11 DIAGNOSIS — K319 Disease of stomach and duodenum, unspecified: Secondary | ICD-10-CM | POA: Diagnosis not present

## 2018-04-11 DIAGNOSIS — I10 Essential (primary) hypertension: Secondary | ICD-10-CM | POA: Insufficient documentation

## 2018-04-11 DIAGNOSIS — K52839 Microscopic colitis, unspecified: Secondary | ICD-10-CM

## 2018-04-11 DIAGNOSIS — D125 Benign neoplasm of sigmoid colon: Secondary | ICD-10-CM | POA: Insufficient documentation

## 2018-04-11 DIAGNOSIS — G8929 Other chronic pain: Secondary | ICD-10-CM | POA: Diagnosis not present

## 2018-04-11 DIAGNOSIS — Z7951 Long term (current) use of inhaled steroids: Secondary | ICD-10-CM | POA: Insufficient documentation

## 2018-04-11 DIAGNOSIS — Z1211 Encounter for screening for malignant neoplasm of colon: Secondary | ICD-10-CM | POA: Insufficient documentation

## 2018-04-11 DIAGNOSIS — R112 Nausea with vomiting, unspecified: Secondary | ICD-10-CM | POA: Diagnosis not present

## 2018-04-11 DIAGNOSIS — Z79899 Other long term (current) drug therapy: Secondary | ICD-10-CM | POA: Insufficient documentation

## 2018-04-11 HISTORY — PX: COLONOSCOPY WITH PROPOFOL: SHX5780

## 2018-04-11 HISTORY — PX: ESOPHAGOGASTRODUODENOSCOPY (EGD) WITH PROPOFOL: SHX5813

## 2018-04-11 SURGERY — COLONOSCOPY WITH PROPOFOL
Anesthesia: General

## 2018-04-11 MED ORDER — PROPOFOL 500 MG/50ML IV EMUL
INTRAVENOUS | Status: AC
  Filled 2018-04-11: qty 50

## 2018-04-11 MED ORDER — LIDOCAINE HCL (CARDIAC) PF 100 MG/5ML IV SOSY
PREFILLED_SYRINGE | INTRAVENOUS | Status: DC | PRN
  Administered 2018-04-11: 50 mg via INTRAVENOUS

## 2018-04-11 MED ORDER — ONDANSETRON HCL 4 MG/2ML IJ SOLN
INTRAMUSCULAR | Status: DC | PRN
  Administered 2018-04-11: 4 mg via INTRAVENOUS

## 2018-04-11 MED ORDER — SODIUM CHLORIDE 0.9 % IV SOLN
INTRAVENOUS | Status: DC
  Administered 2018-04-11: 08:00:00 via INTRAVENOUS
  Administered 2018-04-11: 1000 mL via INTRAVENOUS

## 2018-04-11 MED ORDER — PROPOFOL 10 MG/ML IV BOLUS
INTRAVENOUS | Status: AC
  Filled 2018-04-11: qty 20

## 2018-04-11 MED ORDER — PROPOFOL 10 MG/ML IV BOLUS
INTRAVENOUS | Status: DC | PRN
  Administered 2018-04-11: 20 mg via INTRAVENOUS
  Administered 2018-04-11: 40 mg via INTRAVENOUS
  Administered 2018-04-11: 30 mg via INTRAVENOUS

## 2018-04-11 MED ORDER — PHENYLEPHRINE HCL 10 MG/ML IJ SOLN
INTRAMUSCULAR | Status: DC | PRN
  Administered 2018-04-11 (×8): 100 ug via INTRAVENOUS

## 2018-04-11 MED ORDER — PROPOFOL 500 MG/50ML IV EMUL
INTRAVENOUS | Status: DC | PRN
  Administered 2018-04-11: 85 ug/kg/min via INTRAVENOUS

## 2018-04-11 NOTE — Op Note (Signed)
[facility]astroenter[facility] Patient Name: Breanna Farrell Procedure Date: 04/11/2018 7:58 AM MRN: [id number] Account #: [id number] Date of Birth: 1956/01/15 Admit Type: Outpatient Age: 62 Room: [facility] ENDO ROOM 3 Gender: Female Note Status: Finalized Procedure:            Upper GI endoscopy Indications:          Epigastric abdominal pain, Nausea with vomiting Providers:            Annjanette Wertenberger B. Bonna Gains MD, MD Referring MD:         Angela Adam. Caryl Bis (Referring MD) Medicines:            Monitored Anesthesia Care Complications:        No immediate complications. Procedure:            Pre-Anesthesia Assessment:                       - Prior to the procedure, a History and Physical was                        performed, and patient medications, allergies and                        sensitivities were reviewed. The patient's tolerance of                        previous anesthesia was reviewed.                       - The risks and benefits of the procedure and the                        sedation options and risks were discussed with the                        patient. All questions were answered and informed                        consent was obtained.                       - Patient identification and proposed procedure were                        verified prior to the procedure by the physician, the                        nurse, the anesthesiologist, the anesthetist and the                        technician. The procedure was verified in the procedure                        room.                       - ASA Grade Assessment: II - A patient with mild                        systemic disease.  After obtaining informed consent, the endoscope was                        passed under direct vision. Throughout the procedure,                        the patient's blood pressure, pulse, and oxygen                        saturations were monitored continuously.  The Endoscope                        was introduced through the mouth, and advanced to the                        second part of duodenum. The upper GI endoscopy was                        accomplished with ease. The patient tolerated the                        procedure well. Findings:      The examined esophagus was normal.      The Z-line was regular and was found 35 cm from the incisors.      Patchy mildly erythematous mucosa without bleeding was found in the       gastric antrum. Biopsies were taken with a cold forceps for histology.       Biopsies were obtained in the gastric body, at the incisura and in the       gastric antrum with cold forceps for histology.      Patchy moderate mucosal changes characterized by congestion and altered       texture were found in the gastric body. Biopsies were taken with a cold       forceps for histology.      Localized mucosal changes characterized by lymphagiectasia with oozing       of blood upon washing were found in the gastric fundus. Biopsies were       taken with a cold forceps for histology.      The duodenal bulb, second portion of the duodenum and examined duodenum       were normal. Impression:           - Normal esophagus.                       - Z-line regular, 35 cm from the incisors.                       - Erythematous mucosa in the antrum. Biopsied.                       - Congested and texture changed mucosa in the gastric                        body. Biopsied.                       - Lymphagiectasia with oozing of blood upon washing  mucosa in the gastric fundus. Biopsied.                       - Normal duodenal bulb, second portion of the duodenum                        and examined duodenum.                       - Biopsies were obtained in the gastric body, at the                        incisura and in the gastric antrum. Recommendation:       - Await pathology results.                       -  Discharge patient to home (with escort).                       - Advance diet as tolerated.                       - Continue present medications.                       - Patient has a contact number available for                        emergencies. The signs and symptoms of potential                        delayed complications were discussed with the patient.                        Return to normal activities tomorrow. Written discharge                        instructions were provided to the patient.                       - Discharge patient to home (with escort).                       - The findings and recommendations were discussed with                        the patient.                       - The findings and recommendations were discussed with                        the patient's family. Procedure Code(s):    --- Professional ---                       [phone], Esophagogastroduodenoscopy, flexible, transoral;                        with biopsy, single or multiple Diagnosis Code(s):    --- Professional ---                       K31.89,  Other diseases of stomach and duodenum                       R10.13, Epigastric pain                       R11.2, Nausea with vomiting, unspecified CPT copyright 2017 American Medical Association. All rights reserved. The codes documented in this report are preliminary and upon coder review may  be revised to meet current compliance requirements.  [name], MD Margretta Sidle B. Bonna Gains MD, MD 04/11/2018 8:38:47 AM This report has been signed electronically. Number of Addenda: 0 Note Initiated On: 04/11/2018 7:58 AM Estimated Blood Loss: Estimated blood loss: none.      [facility]

## 2018-04-11 NOTE — Transfer of Care (Signed)
Immediate Anesthesia Transfer of Care Note  Patient: Breanna Farrell  Procedure(s) Performed: COLONOSCOPY WITH PROPOFOL (N/A ) ESOPHAGOGASTRODUODENOSCOPY (EGD) WITH PROPOFOL (N/A )  Patient Location: PACU  Anesthesia Type:MAC  Level of Consciousness: awake, alert  and oriented  Airway & Oxygen Therapy: Patient Spontanous Breathing and Patient connected to nasal cannula oxygen  Post-op Assessment: Report given to RN and Post -op Vital signs reviewed and stable  Post vital signs: stable  Last Vitals:  Vitals Value Taken Time  BP    Temp    Pulse    Resp    SpO2      Last Pain:  Vitals:   04/11/18 0710  TempSrc: Tympanic  PainSc: 6       Patients Stated Pain Goal: 0 (34/37/35 7897)  Complications: No apparent anesthesia complications

## 2018-04-11 NOTE — Anesthesia Preprocedure Evaluation (Signed)
Anesthesia Evaluation  Patient identified by MRN, date of birth, ID band Patient awake    Reviewed: Allergy & Precautions, NPO status , Patient's Chart, lab work & pertinent test results  History of Anesthesia Complications (+) Family history of anesthesia reactionNegative for: history of anesthetic complications (mother with PONV)  Airway Mallampati: III       Dental   Pulmonary neg sleep apnea, neg COPD, former smoker,           Cardiovascular hypertension, Pt. on medications (-) Past MI and (-) CHF (-) dysrhythmias (-) Valvular Problems/Murmurs     Neuro/Psych neg Seizures Anxiety Depression    GI/Hepatic Neg liver ROS, hiatal hernia, GERD  Medicated and Poorly Controlled,  Endo/Other  diabetes, Type 2  Renal/GU negative Renal ROS     Musculoskeletal   Abdominal   Peds  Hematology   Anesthesia Other Findings   Reproductive/Obstetrics                             Anesthesia Physical Anesthesia Plan  ASA: III  Anesthesia Plan: General   Post-op Pain Management:    Induction: Intravenous  PONV Risk Score and Plan: 3 and Propofol infusion, TIVA and Midazolam  Airway Management Planned: Nasal Cannula  Additional Equipment:   Intra-op Plan:   Post-operative Plan:   Informed Consent: I have reviewed the patients History and Physical, chart, labs and discussed the procedure including the risks, benefits and alternatives for the proposed anesthesia with the patient or authorized representative who has indicated his/her understanding and acceptance.     Plan Discussed with:   Anesthesia Plan Comments:         Anesthesia Quick Evaluation

## 2018-04-11 NOTE — Anesthesia Post-op Follow-up Note (Signed)
Anesthesia QCDR form completed.        

## 2018-04-11 NOTE — Anesthesia Postprocedure Evaluation (Signed)
Anesthesia Post Note  Patient: Breanna Farrell  Procedure(s) Performed: COLONOSCOPY WITH PROPOFOL (N/A ) ESOPHAGOGASTRODUODENOSCOPY (EGD) WITH PROPOFOL (N/A )  Patient location during evaluation: Endoscopy Anesthesia Type: General Level of consciousness: awake and alert Pain management: pain level controlled Vital Signs Assessment: post-procedure vital signs reviewed and stable Respiratory status: spontaneous breathing and respiratory function stable Cardiovascular status: stable Anesthetic complications: no     Last Vitals:  Vitals:   04/11/18 0936 04/11/18 0955  BP: 118/81 115/71  Pulse: 74 81  Resp:    Temp:    SpO2: 96% 97%    Last Pain:  Vitals:   04/11/18 1005  TempSrc:   PainSc: 0-No pain                 Saafir Abdullah K

## 2018-04-11 NOTE — Op Note (Signed)
[facility] Patient Name: Breanna Farrell Procedure Date: 04/11/2018 7:57 AM MRN: [id number] Account #: [id number] Date of Birth: 08/30/1955 Admit Type: Outpatient Age: 62 Room: [facility] ENDO ROOM 3 Gender: Female Note Status: Finalized Procedure:            Colonoscopy Indications:          Screening for colorectal malignant neoplasm Providers:            Gerilynn Mccullars B. Bonna Gains MD, MD Referring MD:         Angela Adam. Caryl Bis (Referring MD) Medicines:            Monitored Anesthesia Care Complications:        No immediate complications. Procedure:            Pre-Anesthesia Assessment:                       - ASA Grade Assessment: II - A patient with mild                        systemic disease.                       - Prior to the procedure, a History and Physical was                        performed, and patient medications, allergies and                        sensitivities were reviewed. The patient's tolerance of                        previous anesthesia was reviewed.                       - The risks and benefits of the procedure and the                        sedation options and risks were discussed with the                        patient. All questions were answered and informed                        consent was obtained.                       - Patient identification and proposed procedure were                        verified prior to the procedure by the physician, the                        nurse, the anesthesiologist, the anesthetist and the                        technician. The procedure was verified in the procedure                        room.                       -  Pre-procedure physical examination revealed no                        contraindications to sedation.                       After obtaining informed consent, the colonoscope was                        passed under direct vision. Throughout the procedure,                         the patient's blood pressure, pulse, and oxygen                        saturations were monitored continuously. The                        Colonoscope was introduced through the anus and                        advanced to the the terminal ileum. The colonoscopy was                        performed with ease. The patient tolerated the                        procedure well. The quality of the bowel preparation                        was good. Findings:      The perianal exam findings include non-thrombosed external hemorrhoids.      The terminal ileum appeared normal.      A single small localized angiodysplastic lesion without bleeding was       found in the cecum.      Two sessile polyps were found in the descending colon and cecum. The       polyps were 2 to 4 mm in size. These polyps were removed with a cold       biopsy forceps. Resection and retrieval were complete.      A 7 mm polyp was found in the descending colon. The polyp was flat. Area       was successfully injected with saline for lesion assessment, and this       injection appeared to lift the lesion adequately. Polypectomy was       attempted, initially using a saline injection-lift technique with a hot       snare. Polyp resection was incomplete with this device. This       intervention then required a different device and polypectomy technique.       The polyp was removed with a cold snare. Resection and retrieval were       complete. To prevent bleeding after the polypectomy, one hemostatic clip       was successfully placed. There was no bleeding at the end of the       procedure.      Two sessile polyps were found in the rectum and sigmoid colon. The       polyps were 2 to 3 mm in size. These polyps were removed with a cold       biopsy forceps. Resection and  retrieval were complete.      The exam was otherwise without abnormality.      The rectum, sigmoid colon, descending colon, transverse colon, ascending        colon and cecum appeared normal. Biopsies for histology were taken with       a cold forceps from the cecum, ascending colon, transverse colon,       descending colon, sigmoid colon and rectum for evaluation of microscopic       colitis.      The retroflexed view of the distal rectum and anal verge was normal and       showed no anal or rectal abnormalities. Impression:           - Non-thrombosed external hemorrhoids found on perianal                        exam.                       - The examined portion of the ileum was normal.                       - A single non-bleeding colonic angiodysplastic lesion.                       - Two 2 to 4 mm polyps in the descending colon and in                        the cecum, removed with a cold biopsy forceps. Resected                        and retrieved.                       - One 7 mm polyp in the descending colon, removed with                        a cold snare. Resected and retrieved. Injected. Clip                        was placed.                       - Two 2 to 3 mm polyps in the rectum and in the sigmoid                        colon, removed with a cold biopsy forceps. Resected and                        retrieved.                       - The examination was otherwise normal.                       - The rectum, sigmoid colon, descending colon,                        transverse colon, ascending colon and cecum are normal.  Biopsied.                       - The distal rectum and anal verge are normal on                        retroflexion view. Recommendation:       - Discharge patient to home (with escort).                       - Advance diet as tolerated.                       - Continue present medications.                       - Await pathology results.                       - Repeat colonoscopy in 3 years for surveillance.                       - The findings and recommendations were discussed with                         the patient.                       - The findings and recommendations were discussed with                        the patient's family.                       - Return to primary care physician as previously                        scheduled.                       - High fiber diet.                       - Continue present medications. Procedure Code(s):    --- Professional ---                       [id number], Colonoscopy, flexible; with removal of tumor(s),                        polyp(s), or other lesion(s) by snare technique                       45380, 59, Colonoscopy, flexible; with biopsy, single                        or multiple                       45381, Colonoscopy, flexible; with directed submucosal                        injection(s), any substance Diagnosis Code(s):    --- Professional ---  Z12.11, Encounter for screening for malignant neoplasm                        of colon                       K64.4, Residual hemorrhoidal skin tags                       K55.20, Angiodysplasia of colon without hemorrhage                       D12.4, Benign neoplasm of descending colon                       D12.0, Benign neoplasm of cecum                       K62.1, Rectal polyp                       D12.5, Benign neoplasm of sigmoid colon CPT copyright 2017 American Medical Association. All rights reserved. The codes documented in this report are preliminary and upon coder review may  be revised to meet current compliance requirements.  [name], MD Margretta Sidle B. Bonna Gains MD, MD 04/11/2018 9:43:13 AM This report has been signed electronically. Number of Addenda: 0 Note Initiated On: 04/11/2018 7:57 AM Scope Withdrawal Time: 0 hours 30 minutes 49 seconds  Total Procedure Duration: 0 hours 44 minutes 32 seconds  Estimated Blood Loss: Estimated blood loss: none.      [facility]

## 2018-04-11 NOTE — H&P (Signed)
Breanna Antigua, MD [address], [place], [place], [place], [address] [address], [place], [place], [place], [address] Phone: [phone]  Fax: [phone]  Primary Care Physician:  [name], MD   Pre-Procedure History & Physical: HPI:  Breanna Farrell is a 62 y.o. female is here for a colonoscopy and EGD.   Past Medical History:  Diagnosis Date  . Abnormal cervical Pap smear with positive HPV DNA test 02/01/12  . Anxiety   . Arthritis 2014   rheumatoid  . Articular cartilage disorder    bulging & herniated disc in lower back  . Benign essential hypertension   . Breast tenderness   . Bronchitis 06/05/2017  . Bulging discs 2012  . 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 Depression   . Esophageal reflux   . Family history of adverse reaction to anesthesia    MOM-N/V  . Hernia 2007   hiatal  . History of hiatal hernia    SMALL PER PT  . Hypertension 2007  . Pap smear abnormality of cervix with LGSIL 07/18/2012   BVD    Past Surgical History:  Procedure Laterality Date  . ABDOMINAL HYSTERECTOMY  1986   [name]  . BACK SURGERY  12/27/2012   [facility]  . BREAST BIOPSY Left 11/15/2017   x shape marker path pending UOQ  . BREAST BIOPSY Left 11/15/2017   coil shape marker path pending LOQ  . CARPAL TUNNEL RELEASE Right 07/10/2017   Procedure: CARPAL TUNNEL RELEASE;  Surgeon: Thornton Park, MD;  Location: [facility] ORS;  Service: Orthopedics;  Laterality: Right;  . CESAREAN SECTION  1981  . CESAREAN SECTION  1981   [place]  . CHOLECYSTECTOMY  2007   [place]  . COLONOSCOPY  2010  . COLPOSCOPY    . POLYPECTOMY  2007   one  . UPPER GI ENDOSCOPY  2010    Prior to Admission medications   Medication Sig Start Date End Date Taking? Authorizing Provider  abatacept (ORENCIA) 250 MG injection Inject into the vein every 14 (fourteen) days.   Yes [provider]  Calcium Carbonate-Vit D-Min (CALCIUM 1200 PO) Take 1,200 mg by mouth daily.    Yes [provider]  cetirizine (ZYRTEC) 10 MG tablet Take 10 mg by mouth daily.   Yes [provider]  Cholecalciferol (VITAMIN D-3) 1000 units CAPS Take 2,000 Units by mouth daily.    Yes [provider]  clobetasol cream (TEMOVATE) 0.05 %  01/17/18  Yes [provider]  fluticasone (FLONASE) 50 MCG/ACT nasal spray Place 1 spray into both nostrils daily. 08/08/17  Yes Marylene Land, NP  folic acid (FOLVITE) 297 MCG tablet Take 800 mcg by mouth daily.   Yes [provider]  Omega-3 Fatty Acids (SM FISH OIL) 1000 MG CAPS Take 1,000 mg by mouth daily.    Yes [provider]  predniSONE (DELTASONE) 5 MG tablet  02/25/18  Yes [provider]  RABEprazole (ACIPHEX) 20 MG tablet TAKE 20 MG BY MOUTH EVERY DAY-AM 02/21/18  Yes [name], MD  triamterene-hydrochlorothiazide (DYAZIDE) 37.5-25 MG capsule TAKE 1 CAPSULE BY MOUTH DAILY 03/04/18  Yes [name], MD    Allergies as of 03/29/2018 - Review Complete 03/26/2018  Allergen Reaction Noted  . Hepatitis b vaccine Anaphylaxis 10/24/2012  . Metoclopramide Rash 03/16/2015  . Lyrica [pregabalin] Nausea And Vomiting and Rash 10/24/2012  . Tramadol Nausea And Vomiting, Rash, and Other (See Comments) 10/24/2012    Family History  Problem Relation Age of Onset  . Lung cancer Maternal Grandmother        Event organiser  . Ovarian cancer Maternal Grandmother        Event organiser  . Leukemia Paternal Grandmother   . Melanoma Mother   . Hypertension Mother   . Ovarian cancer Maternal Aunt   . Heart disease Maternal Grandfather   . Breast cancer Neg Hx     Social History   Socioeconomic History  . Marital status: Married    Spouse name: Not on file  . Number of children: Not on file  . Years of education: Not on file  . Highest education level: Not on file  Occupational History  . Not on file  Social Needs  . Financial resource strain: Not on file  . Food  insecurity:    Worry: Not on file    Inability: Not on file  . Transportation needs:    Medical: Not on file    Non-medical: Not on file  Tobacco Use  . Smoking status: Former Smoker    Packs/day: 0.70    Years: 20.00    Pack years: 14.00    Types: Cigarettes    Last attempt to quit: 10/24/2008    Years since quitting: 9.4  . Smokeless tobacco: Never Used  Substance and Sexual Activity  . Alcohol use: Yes    Alcohol/week: 0.0 standard drinks    Comment: SOCIALLY  . Drug use: Never  . Sexual activity: Not on file  Lifestyle  . Physical activity:    Days per week: Not on file    Minutes per session: Not on file  . Stress: Not on file  Relationships  . Social connections:    Talks on phone: Not on file    Gets together: Not on file    Attends religious service: Not on file    Active member of club or organization: Not on file    Attends meetings of clubs or organizations: Not on file    Relationship status: Not on file  . Intimate partner violence:    Fear of current or ex partner: Not on file    Emotionally abused: Not on file    Physically abused: Not on file    Forced sexual activity: Not on file  Other Topics Concern  . Not on file  Social History Narrative  . Not on file    Review of Systems: See HPI, otherwise negative ROS  Physical Exam: BP (!) 127/94   Pulse (!) 118   Temp (!) 97 F (36.1 C) (Tympanic)   Resp 20   Ht 5\' 4"  (1.626 m)   Wt 88.5 kg   SpO2 96%   BMI 33.47 kg/m  General:   Alert,  pleasant and cooperative in NAD Head:  Normocephalic and atraumatic. Neck:  Supple; no masses or thyromegaly. Lungs:  Clear throughout to auscultation, normal respiratory effort.    Heart:  +S1, +S2, Regular rate and rhythm, No edema. Abdomen:  Soft, nontender and nondistended. Normal bowel sounds, without guarding, and without rebound.   Neurologic:  Alert and  oriented x4;  grossly normal neurologically.  Impression/Plan: Breanna Farrell is here for a  colonoscopy to be performed for average risk screening and EGD for abdominal pain, nausea/vomiting.  Risks, benefits, limitations, and alternatives regarding the procedures have been reviewed with the patient.  Questions have been answered.  All parties agreeable.   Virgel Manifold, MD  04/11/2018, 8:07 AM

## 2018-04-12 ENCOUNTER — Encounter: Payer: Self-pay | Admitting: Gastroenterology

## 2018-04-13 LAB — SURGICAL PATHOLOGY

## 2018-04-17 ENCOUNTER — Ambulatory Visit (INDEPENDENT_AMBULATORY_CARE_PROVIDER_SITE_OTHER): Payer: 59 | Admitting: *Deleted

## 2018-04-17 VITALS — BP 140/90 | HR 81 | Resp 18

## 2018-04-17 DIAGNOSIS — I10 Essential (primary) hypertension: Secondary | ICD-10-CM

## 2018-04-17 NOTE — Progress Notes (Addendum)
Patient here for nurse visit BP check per order from 03/26/18.   Patient reports compliance with prescribed BP medications: yes  Last dose of BP medication:  This Morning, patient stated she has been under a lot of stress  Lately selling her house and moving to the [place] on 05/07/18. Patient also said the colonoscopy she had was very stressful for her with prep and with waiting for DX and wanted to know could this be the cause on increased BP readings?   BP Readings from Last 3 Encounters:  04/17/18 (!) 142/90  04/11/18 115/71  03/26/18 130/90   Pulse Readings from Last 3 Encounters:  04/17/18 81  04/11/18 81  03/26/18 77      Patient verbalized understanding of instructions.   Kerin Salen, LPN   see telephone encounter, I have sent in amlodipine.  Margaret arnett, np

## 2018-04-19 ENCOUNTER — Telehealth: Payer: Self-pay | Admitting: Family

## 2018-04-19 ENCOUNTER — Encounter: Payer: Self-pay | Admitting: *Deleted

## 2018-04-19 MED ORDER — AMLODIPINE BESYLATE 2.5 MG PO TABS: 2.5000 mg | ORAL_TABLET | Freq: Every day | ORAL | 3 refills | Status: DC

## 2018-04-19 NOTE — Telephone Encounter (Signed)
Ok sounds good, thanks! LG

## 2018-04-19 NOTE — Telephone Encounter (Signed)
Patient advised of below and verbalized understanding. She is scheduled to follow up with Ander Purpura, NP on 04/29/18. FYI   She will be moving to go out of town.

## 2018-04-19 NOTE — Telephone Encounter (Signed)
Call pt I reviewed recent blood pressures at nurse visit.  I know she under a lot of stress however I would be most comfortable if patient would start a very low dose of amlodipine.  I went ahead and send in 2.5 mg per to take daily to her [facility].  She can start this in addition to her current blood pressure medication, and ensure she had a follow-up with 1 of [place] in the office by myself, Dareen Piano would all be happy to see her and discuss her blood pressure.

## 2018-04-19 NOTE — Addendum Note (Signed)
Addended by: Burnard Hawthorne on: 04/19/2018 09:20 AM   Modules accepted: Orders

## 2018-04-24 ENCOUNTER — Ambulatory Visit (INDEPENDENT_AMBULATORY_CARE_PROVIDER_SITE_OTHER): Payer: 59 | Admitting: Gastroenterology

## 2018-04-24 ENCOUNTER — Encounter: Payer: Self-pay | Admitting: Gastroenterology

## 2018-04-24 VITALS — BP 146/79 | HR 84 | Ht 64.0 in | Wt 197.6 lb

## 2018-04-24 DIAGNOSIS — R945 Abnormal results of liver function studies: Secondary | ICD-10-CM

## 2018-04-24 DIAGNOSIS — R7989 Other specified abnormal findings of blood chemistry: Secondary | ICD-10-CM

## 2018-04-26 LAB — COMPREHENSIVE METABOLIC PANEL
ALBUMIN: 4.9 g/dL — AB (ref 3.6–4.8)
ALK PHOS: 88 IU/L (ref 39–117)
ALT: 99 IU/L — AB (ref 0–32)
AST: 69 IU/L — AB (ref 0–40)
Albumin/Globulin Ratio: 1.8 (ref 1.2–2.2)
BILIRUBIN TOTAL: 0.3 mg/dL (ref 0.0–1.2)
BUN/Creatinine Ratio: 14 (ref 12–28)
BUN: 10 mg/dL (ref 8–27)
CHLORIDE: 98 mmol/L (ref 96–106)
CO2: 22 mmol/L (ref 20–29)
CREATININE: 0.71 mg/dL (ref 0.57–1.00)
Calcium: 10.1 mg/dL (ref 8.7–10.3)
GFR calc Af Amer: 104 mL/min/{1.73_m2} (ref >59)
GFR calc non Af Amer: 90 mL/min/{1.73_m2} (ref >59)
GLUCOSE: 117 mg/dL — AB (ref 65–99)
Globulin, Total: 2.7 g/dL (ref 1.5–4.5)
Potassium: 4.5 mmol/L (ref 3.5–5.2)
Sodium: 138 mmol/L (ref 134–144)
Total Protein: 7.6 g/dL (ref 6.0–8.5)

## 2018-04-26 LAB — MITOCHONDRIAL/SMOOTH MUSCLE AB PNL
Mitochondrial Ab: <20 Units (ref 0.0–20.0)
Smooth Muscle Ab: 10 Units (ref 0–19)

## 2018-04-26 LAB — GASTRIN: Gastrin: 781 pg/mL — ABNORMAL HIGH (ref 0–115)

## 2018-04-29 ENCOUNTER — Ambulatory Visit (INDEPENDENT_AMBULATORY_CARE_PROVIDER_SITE_OTHER): Payer: 59 | Admitting: Family Medicine

## 2018-04-29 ENCOUNTER — Encounter: Payer: Self-pay | Admitting: Family Medicine

## 2018-04-29 VITALS — BP 132/82 | HR 87 | Temp 97.6°F | Ht 64.0 in | Wt 195.8 lb

## 2018-04-29 DIAGNOSIS — I10 Essential (primary) hypertension: Secondary | ICD-10-CM

## 2018-04-29 DIAGNOSIS — Z23 Encounter for immunization: Secondary | ICD-10-CM | POA: Diagnosis not present

## 2018-04-29 DIAGNOSIS — R1013 Epigastric pain: Secondary | ICD-10-CM | POA: Diagnosis not present

## 2018-04-29 DIAGNOSIS — R7989 Other specified abnormal findings of blood chemistry: Secondary | ICD-10-CM

## 2018-04-29 DIAGNOSIS — R945 Abnormal results of liver function studies: Secondary | ICD-10-CM

## 2018-04-29 MED ORDER — RABEPRAZOLE SODIUM 20 MG PO TBEC: DELAYED_RELEASE_TABLET | ORAL | 1 refills | Status: DC

## 2018-04-29 MED ORDER — TRIAMTERENE-HCTZ 37.5-25 MG PO CAPS: 1.0000 | ORAL_CAPSULE | Freq: Every day | ORAL | 1 refills | Status: DC

## 2018-04-29 MED ORDER — AMLODIPINE BESYLATE 2.5 MG PO TABS: 2.5000 mg | ORAL_TABLET | Freq: Every day | ORAL | 1 refills | Status: DC

## 2018-04-29 NOTE — Progress Notes (Signed)
Subjective:    Patient ID: Breanna Farrell, female    DOB: 1956-04-08, 62 y.o.   MRN: [id number]  HPI   Patient presents to clinic for follow-up on hypertension and abdominal pain.  Blood pressures have been well controlled.  Believes some of her recently elevated blood pressures are related to her abdominal pain issues.  She has been following closely with GI due to chronic epigastric discomfort as well as elevated liver functions, and she has been placed on AcipHex which has been very beneficial to help calm pain.  Patient is also recently had a endoscopy on 9/ 5/ 2019.   Endoscopy report and most recent lab work done in September&August 2019 reviewed by me.  CMP Latest Ref Rng & Units 04/24/2018 03/05/2018 07/06/2017  Glucose 65 - 99 mg/dL 117(H) 140(H) 116(H)  BUN 8 - 27 mg/dL 10 9 16   Creatinine 0.57 - 1.00 mg/dL 0.71 0.66 0.86  Sodium 134 - 144 mmol/L 138 135 134(L)  Potassium 3.5 - 5.2 mmol/L 4.5 3.8 3.8  Chloride 96 - 106 mmol/L 98 97(L) 99  CO2 20 - 29 mmol/L 22 30 26   Calcium 8.7 - 10.3 mg/dL 10.1 9.1 9.7  Total Protein 6.0 - 8.5 g/dL 7.6 7.5 7.2  Total Bilirubin 0.0 - 1.2 mg/dL 0.3 0.6 0.4  Alkaline Phos 39 - 117 IU/L 88 74 -  AST 0 - 40 IU/L 69(H) 71(H) 28  ALT 0 - 32 IU/L 99(H) 93(H) 34(H)    Patient is moving to [place], [place]. May 01, 2018 (2 days from now).  She is looking forward to being closer to family and also being in the beach.   Patient Active Problem List   Diagnosis Date Noted  . Abdominal pain, chronic, epigastric   . Stomach irritation   . Intractable vomiting with nausea   . Special screening for malignant neoplasms, colon   . External hemorrhoids   . Angiodysplasia of intestinal tract   . Benign neoplasm of descending colon   . Benign neoplasm of cecum   . Rectal polyp   . Polyp of sigmoid colon   . Abdominal discomfort, epigastric 03/26/2018  . Osteoporosis 02/27/2018  . Leukocytosis 09/14/2017  . Elevated LFTs 09/14/2017    . Carpal tunnel syndrome 07/18/2017  . Lower extremity edema 01/26/2017  . Anxiety and depression 01/26/2017  . GERD (gastroesophageal reflux disease) 11/07/2016  . Psoriatic arthritis ([place]) 03/16/2015  . History of osteoporosis 01/06/2015  . Hyperlipidemia 01/06/2015  . Essential hypertension 01/06/2015  . Diabetes mellitus type 2, controlled ([place]) 01/06/2015   Social History   Tobacco Use  . Smoking status: Former Smoker    Packs/day: 0.70    Years: 20.00    Pack years: 14.00    Types: Cigarettes    Last attempt to quit: 10/24/2008    Years since quitting: 9.5  . Smokeless tobacco: Never Used  Substance Use Topics  . Alcohol use: Yes    Alcohol/week: 0.0 standard drinks    Comment: SOCIALLY   Review of Systems  Constitutional: Negative for chills, fatigue and fever.  HENT: Negative for congestion, ear pain, sinus pain and sore throat.   Eyes: Negative.   Respiratory: Negative for cough, shortness of breath and wheezing.   Cardiovascular: Negative for chest pain, palpitations and leg swelling.  Gastrointestinal: Negative for abdominal pain, diarrhea, nausea and vomiting.  Genitourinary: Negative for dysuria, frequency and urgency.  Musculoskeletal: Negative for arthralgias and myalgias.  Skin: Negative for color change,  pallor and rash.  Neurological: Negative for syncope, light-headedness and headaches.  Psychiatric/Behavioral: The patient is not nervous/anxious.       Objective:   Physical Exam  Constitutional: She appears well-developed and well-nourished. No distress.  Head: Normocephalic and atraumatic.  Eyes: EOM are normal. No scleral icterus.  Neck: Normal range of motion. Neck supple. No tracheal deviation present.  Cardiovascular: Normal rate, regular rhythm and normal heart sounds. No LE edema.  Pulmonary/Chest: Effort normal and breath sounds normal. No respiratory distress. She has no wheezes. She has no rales.  Abdominal: Soft. Bowel sounds are  normal. There is no tenderness.  Neurological: She is alert and oriented to person, place, and time.  Gait normal  Skin: Skin is warm and dry. No pallor.  Psychiatric: She has a normal mood and affect. Her behavior is normal. Thought content normal.   Nursing note and vitals reviewed.    Vitals:   04/29/18 1052  BP: 132/82  Pulse: 87  Temp: 97.6 F (36.4 C)  SpO2: 93%    Assessment & Plan:    Chronic epigastric discomfort/Elevated LFTs - patient will continue AcipHex.  She will continue to follow with GI, she plans to get a new primary care and new gastroenterologist when she moves to [place].   HTN --  BPs well controlled on current meds. It is possible that some of her elevated readings were due to pain.   Refills given on AcipHex, amlodipine & Dyazide  Flu vaccine given  Patient is aware she can contact us throughout her transition to [facility] for any medication refills that she may need and or medical records that need to be sent to the proper place.

## 2018-04-30 ENCOUNTER — Encounter: Payer: Self-pay | Admitting: Family Medicine

## 2018-05-02 NOTE — Progress Notes (Signed)
[name], MD [address]  [place]  [place], [place] [address]  Main: [phone]  Fax: [phone]   Primary Care Physician: [name], MD  Primary Gastroenterologist:  Dr. [name]  Chief Complaint  Patient presents with  . Follow-up    discuss bloodwork    HPI: Breanna Farrell is a 62 y.o. female here for follow up of chronic diarrhea.  Now reporting 1-2 loose bowel state but no episodes of incontinence.  Balance are better.  Denies any abdominal pain.  No nausea vomiting.  Patient in good spirits that she is moving closer to her family, and out of town.  No dysphagia.  No melena or hematochezia.  Last colonoscopy, September 2019, which showed a normal terminal ileum.  AVM in the cecum.  Less than 1 cm polyps removed as detailed in procedure report.  Repeat colonoscopy recommended in 3 years.  EGD showed gastric erythema, congested and abnormal texture of the gastric body.  This was biopsied.  Lymphangiectasia-like lesion at the gastric fundus which was biopsied.  DIAGNOSIS:  A. STOMACH; COLD BIOPSY:  - ANTRAL MUCOSA WITH MILD REACTIVE GASTROPATHY  - OXYNTIC MUCOSA WITH CHANGES CONSISTENT WITH PROTON PUMP INHIBITOR  (PPI) EFFECT.  - NEGATIVE FOR ACTIVE INFLAMMATION, H. PYLORI, INTESTINAL METAPLASIA,  DYSPLASIA, AND MALIGNANCY.   B. STOMACH, FUNDAL LESION/LYMPHANGIECTASIA; COLD BIOPSY:  - GASTRIC XANTHOMA INVOLVING OXYNTIC MUCOSA WITH PPI EFFECT.  - NEGATIVE FOR INTESTINAL METAPLASIA, DYSPLASIA, AND MALIGNANCY.   C. STOMACH, THICKENED FOLD; COLD BIOPSY:  - OXYNTIC GLAND HYPERPLASIA, SEE COMMENT.  - NEGATIVE FOR INTESTINAL METAPLASIA, DYSPLASIA, AND MALIGNANCY.   Comment:  In C there is oxyntic gland hyperplasia to a degree that exceeds what we  usually see with proton pump inhibitors, raising concern for a process  other than PPI effect. Clinical correlation is recommended, with further  work-up for hypergastrinemia as needed.     D. COLON POLYPS X 2, CECUM AND DESCENDING; COLD BIOPSY:  - HYPERPLASTIC POLYP, 1 FRAGMENT, NEGATIVE FOR DYSPLASIA AND MALIGNANCY.  - LYMPHOID AGGREGATE, 1 FRAGMENT, NEGATIVE FOR DYSPLASIA AND MALIGNANCY.   E. TERMINAL ILEUM; COLD BIOPSY:  - ILEAL MUCOSA WITH INTACT VILLI.  - NEGATIVE FOR ACTIVE INFLAMMATION, INFECTIOUS AGENTS, AND GRANULOMAS.   F. COLON; RANDOM COLD BIOPSY:  - NEGATIVE FOR MICROSCOPIC COLITIS AND DYSPLASIA.   G. COLON POLYP, DESCENDING; HOT SNARE:  - SESSILE SERRATED ADENOMA, MULTIPLE FRAGMENTS.  - NEGATIVE FOR CYTOLOGIC DYSPLASIA AND MALIGNANCY.   H. COLON POLYP X 2, RECTUM AND SIGMOID; COLD BIOPSY:  - TUBULAR ADENOMA, 2 FRAGMENTS, NEGATIVE FOR HIGH-GRADE DYSPLASIA AND  MALIGNANCY.  - HYPERPLASTIC POLYP, 2 FRAGMENTS, NEGATIVE FOR DYSPLASIA AND  MALIGNANCY.   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  We do not have the biopsy report.  Provation shows a colonoscopy done by Dr. Sonny Masters which reports a 5 mm rectal polyp that was removed.  Biopsies were obtained from the colon.  Patient states Dr. Vira Agar was following her at the time and put her on Entocort and cholestyramine.  She has not been on this for years.  Care everywhere also shows biopsies reports from an EGD and colonoscopy done in 2014.  Procedure report not available in chart.  A. "GASTRIC" (ENDOSCOPIC BIOPSY):   GASTRIC MUCOSA WITH MILD CHRONIC GASTRITIS.  NO ACTIVE GASTRITIS IS SEEN.  IMMUNOHISTOCHEMISTRY FOR HELICOBACTER PYLORI IS NEGATIVE.  SMALL FUNDIC GLAND POLYP.  NO DYSPLASIA OR CARCINOMA IS SEEN.   B. "COLON" (ENDOSCOPIC BIOPSY):  COLONIC MUCOSA WITH NO PATHOLOGIC DIAGNOSIS.  NO ACTIVE OR CHRONIC COLITIS IS SEEN.  NO EVIDENCE OF LYMPHOCYTIC OR COLLAGENOUS COLITIS IS SEEN.   C. "TERMINAL ILEUM" (ENDOSCOPIC BIOPSY):   SMALL BOWEL MUCOSA WITH REACTIVE LYMPHOID HYPERPLASIA.  NO ACTIVE OR CHRONIC  ENTERITIS IS SEEN.  NEGATIVE FOR DYSPLASIA OR MALIGNANCY.   D. "ESOPHAGUS" (ENDOSCOPIC BIOPSY):   SQUAMOUS EPITHELIUM WITH NO PATHOLOGIC DIAGNOSIS.  NO EVIDENCE OF REFLUX OR EOSINOPHILIC ESOPHAGITIS IS SEEN.  There is also a barium swallow available under imaging in 2010.  This reports hiatal hernia, and otherwise normal esophagus.  She denies any previous history of diagnosis of achalasia, but states she was diagnosed with a loose lower esophageal sphincter in the past.  Previous lab work records obtained, and last labs were done by her rheumatologist in  July 2019 AST 71, ALT 93 June 2019, which showed ALT 73, AST 51 March 2019 ALT 48, AST 40 October 2018 ALT 34 AST 28 February 2017 ALT 28, AST 27  Normal bilirubin and alk phos throughout  July 2019 Hep B surface antigen negative, hep B core total antibody negative, Hep B surface antibody negative, hep C antibody negative Hemoglobin 15.2, MCV 89.3 Platelets 284   Current Outpatient Medications  Medication Sig Dispense Refill  . abatacept (ORENCIA) 250 MG injection Inject into the vein every 14 (fourteen) days.    . Calcium Carbonate-Vit D-Min (CALCIUM 1200 PO) Take 1,200 mg by mouth daily.    . cetirizine (ZYRTEC) 10 MG tablet Take 10 mg by mouth daily.    . Cholecalciferol (VITAMIN D-3) 1000 units CAPS Take 2,000 Units by mouth daily.     . clobetasol cream (TEMOVATE) 0.05 %     . fluticasone (FLONASE) 50 MCG/ACT nasal spray Place 1 spray into both nostrils daily. 1 g 0  . folic acid (FOLVITE) 932 MCG tablet Take 800 mcg by mouth daily.    . Omega-3 Fatty Acids (SM FISH OIL) 1000 MG CAPS Take 1,000 mg by mouth daily.     . predniSONE (DELTASONE) 5 MG tablet     . amLODipine (NORVASC) 2.5 MG tablet Take 1 tablet (2.5 mg total) by mouth daily. 90 tablet 1  . RABEprazole (ACIPHEX) 20 MG tablet TAKE 20 MG BY MOUTH EVERY DAY-AM 90 tablet 1  . triamterene-hydrochlorothiazide (DYAZIDE) 37.5-25 MG capsule Take 1 each (1  capsule total) by mouth daily. 90 capsule 1   No current facility-administered medications for this visit.     Allergies as of 04/24/2018 - Review Complete 04/24/2018  Allergen Reaction Noted  . Hepatitis b vaccine Anaphylaxis 10/24/2012  . Metoclopramide Rash 03/16/2015  . Lyrica [pregabalin] Nausea And Vomiting and Rash 10/24/2012  . Tramadol Nausea And Vomiting, Rash, and Other (See Comments) 10/24/2012    ROS:  General: Negative for anorexia, weight loss, fever, chills, fatigue, weakness. ENT: Negative for hoarseness, difficulty swallowing , nasal congestion. CV: Negative for chest pain, angina, palpitations, dyspnea on exertion, peripheral edema.  Respiratory: Negative for dyspnea at rest, dyspnea on exertion, cough, sputum, wheezing.  GI: See history of present illness. GU:  Negative for dysuria, hematuria, urinary incontinence, urinary frequency, nocturnal urination.  Endo: Negative for unusual weight change.    Physical Examination:   BP (!) 146/79   Pulse 84   Ht _0  (1.626 m)   Wt 197 lb 9.6 oz (89.6 kg)   BMI 33.92 kg/m   General: Well-nourished, well-developed in no acute distress.  Eyes: No icterus. Conjunctivae pink. Mouth: Oropharyngeal mucosa  moist and pink , no lesions erythema or exudate. Neck: Supple, Trachea midline Abdomen: Bowel sounds are normal, nontender, nondistended, no hepatosplenomegaly or masses, no abdominal bruits or hernia , no rebound or guarding.   Extremities: No lower extremity edema. No clubbing or deformities. Neuro: Alert and oriented x 3.  Grossly intact. Skin: Warm and dry, no jaundice.   Psych: Alert and cooperative, normal mood and affect.   Labs: CMP     Component Value Date/Time   NA 138 04/24/2018 1500   K 4.5 04/24/2018 1500   CL 98 04/24/2018 1500   CO2 22 04/24/2018 1500   GLUCOSE 117 (H) 04/24/2018 1500   GLUCOSE 140 (H) 03/05/2018 1051   BUN 10 04/24/2018 1500   CREATININE 0.71 04/24/2018 1500   CREATININE  0.86 07/06/2017 1544   CALCIUM 10.1 04/24/2018 1500   PROT 7.6 04/24/2018 1500   ALBUMIN 4.9 (H) 04/24/2018 1500   AST 69 (H) 04/24/2018 1500   ALT 99 (H) 04/24/2018 1500   ALKPHOS 88 04/24/2018 1500   BILITOT 0.3 04/24/2018 1500   GFRNONAA 90 04/24/2018 1500   GFRAA 104 04/24/2018 1500   Lab Results  Component Value Date   WBC 11.4 (H) 07/06/2017   HGB 13.9 07/06/2017   HCT 39.2 07/06/2017   MCV 87.3 07/06/2017   PLT 339 07/06/2017    Imaging Studies: No results found.  Assessment and Plan:   Breanna Farrell is a 62 y.o. 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  Will order  -Elevated transaminases Her transaminases have been noted to be elevated recently and she was recently started on Orencia and it is likely due to medication side effect [email or web address] The above website mentions elevated transaminases with this medication as an adverse effect.  Her repeat CMP today showed stable AST and ALT from last time with AST of 69, ALT of 99 and previous labs as per HPI  Liver ultrasound on last visit only showed hepatic steatosis in August 2019 With extensive work-up including antimitochondrial, anti-smooth muscle antibody, IgG4, HCVRNA, hepatitis B serology, hep A antibody, ceruloplasmin, ferritin were all negative  Patient started the new  medication, Orencia around March or April of this year and that is when her transaminases were noted to be elevated and were normal prior to that as seen in HPI labs listed above.  We will inform Dr. Caryl Bis in this regard to the can communicate with her rheumatologist to see if her medication can be changed due to persistently elevated liver enzymes    Dr [name]

## 2018-05-07 ENCOUNTER — Telehealth: Payer: Self-pay | Admitting: Family Medicine

## 2018-05-07 NOTE — Telephone Encounter (Signed)
Please contact the patient and find out who her rheumatologist is.  It appears that GI felt her elevated liver enzymes were related to her Orencia.  Her rheumatologist should be made aware of this so that they can make any changes if necessary or monitor.  Thanks.

## 2018-05-07 NOTE — Telephone Encounter (Signed)
-----   Message from Virgel Manifold, MD sent at 05/02/2018  4:26 PM EDT ----- Hi Dr. Caryl Bis,  Thank you for referring this patient to me.  I believe her elevated liver enzymes may be coming from her new medication Orencia.  It appears that the medication was started around March or April of this year and her liver enzymes started increasing after that.  This website mentions this adverse effect [email or web address]  Her rheumatologist would thus need to decide if her Maureen Chatters can be changed or if they would like to continue to monitor her liver enzymes to see if they improve on the medication.  If you could reach out to them in this regard that would be great.  The rest of her liver work-up has not shown any etiology of her elevated liver enzymes.  She also has an elevated gastrin level, and this will need further work-up.  Since she may be moving soon, we will try to have these records sent to her new PCP or gastroenterologist at her new location.  Thank you

## 2018-05-08 NOTE — Telephone Encounter (Signed)
Currently Rheumatologist: New since the move -- Dr.Melissa Terchek   Address: [address], [place], [place] [address]  Phone: [phone]    Prior Rheumatologist: Dr. Dawayne Patricia. Dossie Der, MD  Address: [address] [place], [place] [address]  Phone: [phone]

## 2018-05-08 NOTE — Telephone Encounter (Signed)
Letter created.  Please print out recent lab work so I can review it and then we can send to the patient's current rheumatologist once reviewed.

## 2018-05-09 ENCOUNTER — Telehealth: Payer: Self-pay

## 2018-05-09 NOTE — Telephone Encounter (Signed)
I spoke with Breanna Farrell and she has completed her move. She is relating her issues to the medication Orencia because it started about 3 months after started it. She has spoken with Dr. Biagio Quint and feels better about things. She has an appt with rheumatology soon and will explore her options. Breanna Farrell was informed of her labs and suggestions. She will contact office for her records when things are set up. She may pull what she can from her My Chart.

## 2018-05-09 NOTE — Telephone Encounter (Signed)
-----   Message from Virgel Manifold, MD sent at 05/02/2018  4:26 PM EDT ----- Jackelyn Poling please let patient know, her gastrin level was high.  This can be seen in patients who are on chronic acid reducers like you are. However, it needs further evaluation to rule out any other causes like cancers.  Since she is moving, she will need to let us know who her new primary care provider would be and we can send the records to them, and her gastroenterologist.  They may either repeat the level, or order other tests like gastric pH and secretin stimulation test.  Her anti-mitochondrial and anti-smooth muscle antibodies were negative which is good.  Her liver enzymes are still mildly elevated like before.  I would recommend that she speak to her rheumatologist to see if her Maureen Chatters can be changed as it can lead to elevated liver enzymes.  Other alternative would be monitoring her liver enzymes on Orencia, but this would need to be discussed with her rheumatologist.  I have messaged Dr. Caryl Bis in this regard as well.

## 2018-05-13 NOTE — Telephone Encounter (Signed)
What lab work are you wanting the lab work in [place] or lab work from outside the practice?

## 2018-05-14 NOTE — Telephone Encounter (Signed)
It is the lab work recently done in [place].  Is there a way to print this out without the result notes from her GI physician?

## 2018-05-15 NOTE — Telephone Encounter (Signed)
I believe that is the only way to do it the last time I talked to [place] she stated that was the only way to do it.

## 2018-05-21 NOTE — Telephone Encounter (Signed)
Please follow-up with the patient and find out if she is established with a new primary care doctor and rheumatologist.  We can fax them the lab results.  It looks like she also needed to follow-up with a GI physician based on review notes from her GI physician.  Please reinforce that.

## 2018-05-21 NOTE — Telephone Encounter (Signed)
Called and spoke with pt. Pt has NOT gotten set up to see an new PCP yet. However, she does have an appt with Rheumatologist Dr. Rosanne Sack their fax number is [phone].  Pt aware that she will need to follow up with GI as well. Sent to PCP to send lab results that are needed or advise me what you would like to have faxed.  Thanks

## 2018-05-22 ENCOUNTER — Telehealth: Payer: Self-pay | Admitting: *Deleted

## 2018-05-22 NOTE — Telephone Encounter (Signed)
Copied from [place] [phone]. Topic: General - Inquiry >> May 22, 2018  9:43 AM Conception Chancy, NT wrote: Reason for CRM: patient is calling and requesting to speak with Dr. Caryl Bis nurse in regards to her moving. Please advise.

## 2018-05-23 ENCOUNTER — Telehealth: Payer: Self-pay | Admitting: Gastroenterology

## 2018-05-23 NOTE — Telephone Encounter (Signed)
Pt is calling  She needs a referral to herr new GI from [place] to [facility] in [place] she also needs her records send there. Fax number is [phone]  Address [address] [place].   Call for more information if needed.

## 2018-05-28 NOTE — Telephone Encounter (Signed)
See most recent phone note. She has requested records from her [facility] to go to her new GI.

## 2018-05-28 NOTE — Telephone Encounter (Signed)
She just needs the records from GI. We can provide any of our notes that may be requested by her new GI or PCP.

## 2018-05-28 NOTE — Telephone Encounter (Signed)
Pt did find a GI doctor pt stated that she contacted [facility] to have them fax her medical records the new GI doctor that she will be seeing at [place]. They will be the ones to get her set up to see an interest doctor. Pt wanted to know if Dr.Sonnenberg has any input on anything he wanted to fax them since he was heavily involved in her situation?     Company is called: [facility]  Fax number [phone] Phone number: [phone]  [employer]

## 2018-05-29 NOTE — Telephone Encounter (Signed)
Called and spoke with pt. Pt advised and voiced understanding. Nothing has been faxed to her New GI doctor we will wait to see if they need or request anything to be faxed to them.

## 2018-05-29 NOTE — Telephone Encounter (Signed)
Records faxed today

## 2018-05-30 ENCOUNTER — Ambulatory Visit: Payer: Self-pay | Admitting: Gastroenterology

## 2018-07-15 ENCOUNTER — Ambulatory Visit: Payer: Self-pay | Admitting: Family Medicine

## 2018-08-19 DIAGNOSIS — K529 Noninfective gastroenteritis and colitis, unspecified: Secondary | ICD-10-CM | POA: Insufficient documentation

## 2018-11-04 ENCOUNTER — Telehealth: Payer: Self-pay | Admitting: Gastroenterology

## 2018-11-04 NOTE — Telephone Encounter (Signed)
Clinic staff, Driscilla Grammes contacted patient's new Gastroenterologist and as per her note sent to me:  "Contacted [facility]. Receptionist states that patient was seen in November and she has a follow up scheduled for April 27th. Gastrin levels were checked on her November visit.   [facility]  [phone] "  Therefore pt has been appropriately established with new GI and her workup is underway there

## 2021-01-13 DIAGNOSIS — K760 Fatty (change of) liver, not elsewhere classified: Secondary | ICD-10-CM | POA: Insufficient documentation

## 2021-01-13 DIAGNOSIS — L409 Psoriasis, unspecified: Secondary | ICD-10-CM | POA: Insufficient documentation

## 2021-03-08 DIAGNOSIS — M5416 Radiculopathy, lumbar region: Secondary | ICD-10-CM | POA: Insufficient documentation

## 2021-03-08 DIAGNOSIS — M7061 Trochanteric bursitis, right hip: Secondary | ICD-10-CM | POA: Insufficient documentation

## 2021-03-17 DIAGNOSIS — M47816 Spondylosis without myelopathy or radiculopathy, lumbar region: Secondary | ICD-10-CM | POA: Insufficient documentation

## 2021-05-30 LAB — HM COLONOSCOPY

## 2021-08-29 DIAGNOSIS — M1611 Unilateral primary osteoarthritis, right hip: Secondary | ICD-10-CM | POA: Insufficient documentation

## 2022-03-29 DIAGNOSIS — Z96641 Presence of right artificial hip joint: Secondary | ICD-10-CM | POA: Insufficient documentation

## 2023-05-14 ENCOUNTER — Encounter: Payer: Self-pay | Admitting: Family Medicine

## 2023-05-14 ENCOUNTER — Ambulatory Visit: Payer: Medicare Other | Admitting: Family Medicine

## 2023-05-14 VITALS — BP 142/88 | HR 76 | Temp 97.9°F | Resp 16 | Ht 64.0 in | Wt 196.5 lb

## 2023-05-14 DIAGNOSIS — M329 Systemic lupus erythematosus, unspecified: Secondary | ICD-10-CM

## 2023-05-14 DIAGNOSIS — Z114 Encounter for screening for human immunodeficiency virus [HIV]: Secondary | ICD-10-CM

## 2023-05-14 DIAGNOSIS — E118 Type 2 diabetes mellitus with unspecified complications: Secondary | ICD-10-CM

## 2023-05-14 DIAGNOSIS — E781 Pure hyperglyceridemia: Secondary | ICD-10-CM

## 2023-05-14 DIAGNOSIS — Z1231 Encounter for screening mammogram for malignant neoplasm of breast: Secondary | ICD-10-CM

## 2023-05-14 DIAGNOSIS — E785 Hyperlipidemia, unspecified: Secondary | ICD-10-CM

## 2023-05-14 DIAGNOSIS — E1159 Type 2 diabetes mellitus with other circulatory complications: Secondary | ICD-10-CM

## 2023-05-14 DIAGNOSIS — K9089 Other intestinal malabsorption: Secondary | ICD-10-CM

## 2023-05-14 DIAGNOSIS — E1169 Type 2 diabetes mellitus with other specified complication: Secondary | ICD-10-CM | POA: Diagnosis not present

## 2023-05-14 DIAGNOSIS — M069 Rheumatoid arthritis, unspecified: Secondary | ICD-10-CM

## 2023-05-14 DIAGNOSIS — I152 Hypertension secondary to endocrine disorders: Secondary | ICD-10-CM | POA: Diagnosis not present

## 2023-05-14 DIAGNOSIS — E538 Deficiency of other specified B group vitamins: Secondary | ICD-10-CM | POA: Diagnosis not present

## 2023-05-14 DIAGNOSIS — M810 Age-related osteoporosis without current pathological fracture: Secondary | ICD-10-CM

## 2023-05-14 DIAGNOSIS — Z1159 Encounter for screening for other viral diseases: Secondary | ICD-10-CM

## 2023-05-14 DIAGNOSIS — L4050 Arthropathic psoriasis, unspecified: Secondary | ICD-10-CM

## 2023-05-14 DIAGNOSIS — E559 Vitamin D deficiency, unspecified: Secondary | ICD-10-CM

## 2023-05-14 DIAGNOSIS — M1712 Unilateral primary osteoarthritis, left knee: Secondary | ICD-10-CM

## 2023-05-14 LAB — COMPREHENSIVE METABOLIC PANEL
ALT: 19 U/L (ref 0–35)
AST: 23 U/L (ref 0–37)
Albumin: 4.5 g/dL (ref 3.5–5.2)
Alkaline Phosphatase: 65 U/L (ref 39–117)
BUN: 14 mg/dL (ref 6–23)
CO2: 29 meq/L (ref 19–32)
Calcium: 10.2 mg/dL (ref 8.4–10.5)
Chloride: 98 meq/L (ref 96–112)
Creatinine, Ser: 0.79 mg/dL (ref 0.40–1.20)
GFR: 76.22 mL/min (ref >60.00)
Glucose, Bld: 96 mg/dL (ref 70–99)
Potassium: 3.5 meq/L (ref 3.5–5.1)
Sodium: 138 meq/L (ref 135–145)
Total Bilirubin: 0.6 mg/dL (ref 0.2–1.2)
Total Protein: 7.5 g/dL (ref 6.0–8.3)

## 2023-05-14 LAB — LIPID PANEL
Cholesterol: 293 mg/dL — ABNORMAL HIGH (ref 0–200)
HDL: 36.9 mg/dL — ABNORMAL LOW (ref >39.00)
Total CHOL/HDL Ratio: 8
Triglycerides: 883 mg/dL — ABNORMAL HIGH (ref 0.0–149.0)

## 2023-05-14 LAB — VITAMIN D 25 HYDROXY (VIT D DEFICIENCY, FRACTURES): VITD: 30.95 ng/mL (ref 30.00–100.00)

## 2023-05-14 LAB — MICROALBUMIN / CREATININE URINE RATIO
Creatinine,U: 59.3 mg/dL
Microalb Creat Ratio: 1.2 mg/g (ref 0.0–30.0)
Microalb, Ur: <0.7 mg/dL (ref 0.0–1.9)

## 2023-05-14 LAB — LDL CHOLESTEROL, DIRECT: Direct LDL: 61 mg/dL

## 2023-05-14 LAB — HEMOGLOBIN A1C: Hgb A1c MFr Bld: 6.2 % (ref 4.6–6.5)

## 2023-05-14 LAB — VITAMIN B12: Vitamin B-12: 211 pg/mL (ref 211–911)

## 2023-05-14 NOTE — Patient Instructions (Addendum)
It was a pleasure meeting you today. Thank you for allowing me to take part in your health care.  Our goals for today as we discussed include:  Monitor blood pressure at home Goal <140/90 Continue current medication  Follow up in 3 months or sooner if blood pressure continues to elevate >140/90  We will get some labs today.  If they are abnormal or we need to do something about them, I will call you.  If they are normal, I will send you a message on MyChart (if it is active) or a letter in the mail.  If you don't hear from [place] in 2 weeks, please call the office at the number below.     This is a list of the screening recommended for you and due dates:  Health Maintenance  Topic Date Due   Medicare Annual Wellness Visit  Never done   Complete foot exam   Never done   Eye exam for diabetics  Never done   Yearly kidney health urinalysis for diabetes  Never done   Zoster (Shingles) Vaccine (1 of 2) Never done   Hemoglobin A1C  01/03/2018   Pneumonia Vaccine (2 of 2 - PCV) 10/04/2018   DEXA scan (bone density measurement)  Never done   Yearly kidney function blood test for diabetes  04/25/2019   Mammogram  10/10/2019   DTaP/Tdap/Td vaccine (2 - Tdap) 09/05/2022   COVID-19 Vaccine (1 - 2023-24 season) Never done   Flu Shot  11/05/2023*   Colon Cancer Screening  04/11/2028   Hepatitis C Screening  Addressed   HPV Vaccine  Aged Out  *Topic was postponed. The date shown is not the original due date.    Please have Orthopedics send me surgical clearance forms  Will have records from Rheumatology, GI and PCP forwarded as well   If you have any questions or concerns, please do not hesitate to call the office at [phone].  I look forward to our next visit and until then take care and stay safe.  Regards,   Dana Allan, MD   [facility]

## 2023-05-14 NOTE — Progress Notes (Signed)
SUBJECTIVE:   Chief Complaint  Patient presents with   Establish Care   HPI Patient presents to establish care  Discussed the use of AI scribe software for clinical note transcription with the patient, who gave verbal consent to proceed.  History of Present Illness The patient, previously under the care of Dr. Birdie Sons, presents for establishment of primary care following a recent relocation back to town. The patient reports a history of rheumatoid arthritis (RA) and psoriatic arthritis, currently managed with Rinvoq, which she describes as the most effective treatment she's had to date. Despite this, she notes ongoing joint destruction, particularly in the left knee, which is described as 'bone on bone.' An orthopedic surgeon is scheduled to perform a knee replacement in the near future, pending medical clearance.  In addition to her arthritis, the patient has a history of significant back surgery in 2022, involving multilevel fusion, laminectomy, and discectomies, followed by right hip surgery in 2023. She reports good recovery from both procedures.  The patient also has a history of colitis, initially thought to be related to gallstones, which resolved after gallbladder removal. 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 She is currently managed with Colestid.  The patient is also on amlodipine and dyazide for hypertension, which she reports is usually well-controlled, with home readings typically around 130. However, she notes that her blood pressure has been higher recently due to pain from her knee. She also reports a history of high cholesterol, attributed to years of steroid use, but notes that her most recent labs in June were normal.  The patient has a history of severe osteoporosis and osteopenia, managed by a rheumatologist. She was recently  started on Evenity, which was temporarily discontinued due to her move. She also reports a recent diagnosis of a squamous cell carcinoma, which required treatment.  The patient denies any history of diabetes, thyroid issues, respiratory problems, or heart problems. She also denies any history of smoking. She reports a history of severe fall allergies, which have worsened with age and her autoimmune condition. She also notes a recent flu shot caused a significant reaction, with a large, red swelling at the injection site.    PERTINENT PMH / PSH: As above SLE RA Psoriatic Arthritis Hepatic Steatosis   OBJECTIVE:  BP (!) 142/88   Pulse 76   Temp 97.9 F (36.6 C)   Resp 16   Ht 5\' 4"  (1.626 m)   Wt 196 lb 8 oz (89.1 kg)   SpO2 97%   BMI 33.73 kg/m    Physical Exam Vitals reviewed.  Constitutional:      General: She is not in acute distress.    Appearance: She is not ill-appearing.  HENT:     Head: Normocephalic.     Right Ear: Tympanic membrane, ear canal and external ear normal.     Left Ear: Tympanic membrane, ear canal and external ear normal.     Nose: Nose normal.     Mouth/Throat:     Mouth: Mucous membranes are moist.  Eyes:     Extraocular Movements: Extraocular movements intact.     Conjunctiva/sclera: Conjunctivae normal.     Pupils: Pupils are equal, round, and reactive to light.  Neck:     Thyroid: No thyromegaly or thyroid tenderness.     Vascular: No carotid bruit.  Cardiovascular:     Rate  and Rhythm: Normal rate and regular rhythm.     Pulses: Normal pulses.     Heart sounds: Normal heart sounds.  Pulmonary:     Effort: Pulmonary effort is normal.     Breath sounds: Normal breath sounds.  Abdominal:     General: Bowel sounds are normal. There is no distension.     Palpations: Abdomen is soft.     Tenderness: There is no abdominal tenderness. There is no right CVA tenderness, left CVA tenderness, guarding or rebound.  Musculoskeletal:        General:  Normal range of motion.     Cervical back: Normal range of motion.     Right lower leg: No edema.     Left lower leg: No edema.  Lymphadenopathy:     Cervical: No cervical adenopathy.  Skin:    Capillary Refill: Capillary refill takes less than 2 seconds.  Neurological:     General: No focal deficit present.     Mental Status: She is alert and oriented to person, place, and time. Mental status is at baseline.     Motor: No weakness.  Psychiatric:        Mood and Affect: Mood normal.        Behavior: Behavior normal.        Thought Content: Thought content normal.        Judgment: Judgment normal.        05/14/2023    9:17 AM 03/26/2018    3:45 PM  Depression screen PHQ 2/9  Decreased Interest 0 0  Down, Depressed, Hopeless 0 0  PHQ - 2 Score 0 0  Altered sleeping 0   Tired, decreased energy 0   Change in appetite 0   Feeling bad or failure about yourself  0   Trouble concentrating 0   Moving slowly or fidgety/restless 0   Suicidal thoughts 0   PHQ-9 Score 0   Difficult doing work/chores Not difficult at all       05/14/2023    9:17 AM  GAD 7 : Generalized Anxiety Score  Nervous, Anxious, on Edge 0  Control/stop worrying 0  Worry too much - different things 0  Trouble relaxing 0  Restless 0  Easily annoyed or irritable 0  Afraid - awful might happen 0  Total GAD 7 Score 0  Anxiety Difficulty Not difficult at all    ASSESSMENT/PLAN:  Type 2 diabetes mellitus with complications [facility]) Assessment & Plan: Chart review shows previous documentation of diabetes.  A1c 6.6 in 2016.   Not currently on medication and diet controlled Not on statin or ACEi/ARB Request records from former PCP in [facility] and Foot exam Check labs today   Orders: -     Hemoglobin A1c -     Microalbumin / creatinine urine ratio -     CBC; Future  Hypertension associated with diabetes (HCC) -     Comprehensive metabolic panel -     Ambulatory referral to  Cardiology  Hyperlipidemia associated with type 2 diabetes mellitus (HCC) Assessment & Plan: Not on statin.  No documentation of statin myopathy Request previous health records Check fasting lipids Recommend statin given history if DMT2 The 10-year ASCVD risk score (Arnett DK, et al., 2019) is: 30.5%   Values used to calculate the score:     Age: 68 years     Sex: Female     Is Non-Hispanic African American: No     Diabetic: Yes  Tobacco smoker: No     Systolic Blood Pressure: 142 mmHg     Is BP treated: Yes     HDL Cholesterol: 36.9 mg/dL     Total Cholesterol: 293 mg/dL   Orders: -     Lipid panel -     LDL cholesterol, direct -     Ambulatory referral to Cardiology  Vitamin D deficiency -     VITAMIN D 25 Hydroxy (Vit-D Deficiency, Fractures)  Vitamin B 12 deficiency -     Vitamin B12  Need for hepatitis C screening test -     Hepatitis C antibody  Breast cancer screening by mammogram -     3D Screening Mammogram, Left and Right; Future  Hypertriglyceridemia -     Ambulatory referral to Cardiology  Systemic lupus erythematosus, unspecified SLE type, unspecified organ involvement status (HCC) Assessment & Plan: Managed by a rheumatologist  -Continue current management with rheumatologist in [place]  Orders: -     Ambulatory referral to Cardiology  Rheumatoid arthritis, involving unspecified site, unspecified whether rheumatoid factor present [facility]) Assessment & Plan: Managed by a rheumatologist with Rinvoq. History of multiple joint surgeries. -Continue current management with rheumatologist in [facility]   Psoriatic arthritis [facility]) Assessment & Plan: Managed by a rheumatologist with Rinvoq. History of multiple joint surgeries. -Continue current management with rheumatologist in [facility]   Primary osteoarthritis of left knee Assessment & Plan: Severe pain due to bone-on-bone contact and presence of bone spurs. Surgery scheduled for 05/19/2023. -Preoperative clearance  needed.  Unable to provide clearance today.  Discussed with patient will need to have medical records sent from previous PCP to review before providing clearance.     Osteoporosis, unspecified osteoporosis type, unspecified pathological fracture presence Assessment & Plan: Severe, previously on Evenity, but discontinued due to relocation. -Consider resuming Evenity post-surgery, depending on risk assessment.   Bile acid malabsorption syndrome Assessment & Plan: Managed by GI in [place] with Colestid twice daily. -Continue current management.    General Health Maintenance -Consider mammogram post-surgery. -Consider DEXA scan post-surgery. -Consider colonoscopy in 9yrs as per previous recommendation. -Declined shingles vaccine. -Pneumonia vaccines up to date.    PDMP reviewed  Return in about 3 months (around 08/14/2023), or if symptoms worsen or fail to improve, for PCP.  Dana Allan, MD

## 2023-05-15 ENCOUNTER — Telehealth: Payer: Self-pay | Admitting: Family Medicine

## 2023-05-15 LAB — HEPATITIS C ANTIBODY: Hepatitis C Ab: NONREACTIVE

## 2023-05-15 NOTE — Telephone Encounter (Signed)
Patient called and wanted to know if the medical clearance form was fax to [facility]. She also said that [facility] would like a copy of patients recent labs fax to them also.

## 2023-05-16 NOTE — Telephone Encounter (Signed)
Pt would like to be called regarding a referral

## 2023-05-16 NOTE — Telephone Encounter (Signed)
Referral placed.

## 2023-05-18 NOTE — Telephone Encounter (Signed)
Called pt and she stated that the hospital called and stated that they seen her labs but you did not order a CBC and she needs one can we order one and I will call and have her do a lab only appointment?

## 2023-05-19 ENCOUNTER — Encounter: Payer: Self-pay | Admitting: Family Medicine

## 2023-05-19 DIAGNOSIS — M329 Systemic lupus erythematosus, unspecified: Secondary | ICD-10-CM | POA: Insufficient documentation

## 2023-05-19 DIAGNOSIS — E538 Deficiency of other specified B group vitamins: Secondary | ICD-10-CM | POA: Insufficient documentation

## 2023-05-19 DIAGNOSIS — Z1159 Encounter for screening for other viral diseases: Secondary | ICD-10-CM | POA: Insufficient documentation

## 2023-05-19 DIAGNOSIS — E781 Pure hyperglyceridemia: Secondary | ICD-10-CM | POA: Insufficient documentation

## 2023-05-19 DIAGNOSIS — K9089 Other intestinal malabsorption: Secondary | ICD-10-CM | POA: Insufficient documentation

## 2023-05-19 DIAGNOSIS — E559 Vitamin D deficiency, unspecified: Secondary | ICD-10-CM | POA: Insufficient documentation

## 2023-05-19 DIAGNOSIS — M1990 Unspecified osteoarthritis, unspecified site: Secondary | ICD-10-CM | POA: Insufficient documentation

## 2023-05-19 DIAGNOSIS — Z1231 Encounter for screening mammogram for malignant neoplasm of breast: Secondary | ICD-10-CM | POA: Insufficient documentation

## 2023-05-19 DIAGNOSIS — E1159 Type 2 diabetes mellitus with other circulatory complications: Secondary | ICD-10-CM | POA: Insufficient documentation

## 2023-05-19 DIAGNOSIS — M069 Rheumatoid arthritis, unspecified: Secondary | ICD-10-CM | POA: Insufficient documentation

## 2023-05-19 NOTE — Assessment & Plan Note (Addendum)
Severe, previously on Evenity, but discontinued due to relocation. -Consider resuming Evenity post-surgery, depending on risk assessment.

## 2023-05-19 NOTE — Assessment & Plan Note (Addendum)
On Amlodipine 2.5mg  daily and Dyazide 37.5-25 mg daily. Recent blood pressure readings elevated, possibly due to pain and stress. Goal <140/90 -Check blood pressure at home post-surgery. If consistently elevated, consider increasing medication. -Consider addition of ACEi/ARB given history of DMT2

## 2023-05-19 NOTE — Assessment & Plan Note (Addendum)
Managed by a rheumatologist  -Continue current management with rheumatologist in [facility]

## 2023-05-19 NOTE — Assessment & Plan Note (Addendum)
Not on statin.  No documentation of statin myopathy Request previous health records Check fasting lipids Recommend statin given history if DMT2 The 10-year ASCVD risk score (Arnett DK, et al., 2019) is: 30.5%   Values used to calculate the score:     Age: 67 years     Sex: Female     Is Non-Hispanic African American: No     Diabetic: Yes     Tobacco smoker: No     Systolic Blood Pressure: 142 mmHg     Is BP treated: Yes     HDL Cholesterol: 36.9 mg/dL     Total Cholesterol: 293 mg/dL

## 2023-05-19 NOTE — Assessment & Plan Note (Signed)
Chart review shows previous documentation of diabetes.  A1c 6.6 in 2016.   Not currently on medication and diet controlled Not on statin or ACEi/ARB Request records from former PCP in [facility] and Foot exam Check labs today

## 2023-05-19 NOTE — Assessment & Plan Note (Addendum)
Managed by a rheumatologist with Rinvoq. History of multiple joint surgeries. -Continue current management with rheumatologist in [facility]

## 2023-05-19 NOTE — Assessment & Plan Note (Addendum)
Severe pain due to bone-on-bone contact and presence of bone spurs. Surgery scheduled for 05/19/2023. -Preoperative clearance needed.  Unable to provide clearance today.  Discussed with patient will need to have medical records sent from previous PCP to review before providing clearance.

## 2023-05-19 NOTE — Assessment & Plan Note (Signed)
Managed by GI in [place] with Colestid twice daily. -Continue current management.

## 2023-05-28 ENCOUNTER — Other Ambulatory Visit: Payer: Medicare Other

## 2023-05-28 DIAGNOSIS — E118 Type 2 diabetes mellitus with unspecified complications: Secondary | ICD-10-CM

## 2023-05-29 LAB — CBC
HCT: 40.4 % (ref 36.0–46.0)
Hemoglobin: 13.2 g/dL (ref 12.0–15.0)
MCHC: 32.7 g/dL (ref 30.0–36.0)
MCV: 90.4 fL (ref 78.0–100.0)
Platelets: 284 10*3/uL (ref 150.0–400.0)
RBC: 4.47 Mil/uL (ref 3.87–5.11)
RDW: 13.1 % (ref 11.5–15.5)
WBC: 9.2 10*3/uL (ref 4.0–10.5)

## 2023-06-01 NOTE — Telephone Encounter (Signed)
Pt see cardiologist on 06/06/2023

## 2023-06-03 ENCOUNTER — Encounter: Payer: Self-pay | Admitting: Family Medicine

## 2023-06-06 ENCOUNTER — Encounter: Payer: Self-pay | Admitting: Cardiology

## 2023-06-06 ENCOUNTER — Ambulatory Visit: Payer: Medicare Other | Attending: Cardiology | Admitting: Cardiology

## 2023-06-06 VITALS — BP 112/82 | HR 79 | Wt 197.4 lb

## 2023-06-06 DIAGNOSIS — I4510 Unspecified right bundle-branch block: Secondary | ICD-10-CM | POA: Diagnosis present

## 2023-06-06 DIAGNOSIS — Z01818 Encounter for other preprocedural examination: Secondary | ICD-10-CM | POA: Diagnosis present

## 2023-06-06 DIAGNOSIS — E781 Pure hyperglyceridemia: Secondary | ICD-10-CM | POA: Insufficient documentation

## 2023-06-06 DIAGNOSIS — R9431 Abnormal electrocardiogram [ECG] [EKG]: Secondary | ICD-10-CM | POA: Insufficient documentation

## 2023-06-06 DIAGNOSIS — R0609 Other forms of dyspnea: Secondary | ICD-10-CM | POA: Diagnosis present

## 2023-06-06 DIAGNOSIS — R7303 Prediabetes: Secondary | ICD-10-CM | POA: Diagnosis present

## 2023-06-06 DIAGNOSIS — E785 Hyperlipidemia, unspecified: Secondary | ICD-10-CM | POA: Diagnosis present

## 2023-06-06 DIAGNOSIS — I10 Essential (primary) hypertension: Secondary | ICD-10-CM | POA: Diagnosis present

## 2023-06-06 MED ORDER — ATORVASTATIN CALCIUM 10 MG PO TABS: 10.0000 mg | ORAL_TABLET | Freq: Every day | ORAL | 3 refills | Status: DC

## 2023-06-06 NOTE — Patient Instructions (Addendum)
Medication Instructions:  Your physician has recommended you make the following change in your medication:  START: Lipitor 10 mg once daily *If you need a refill on your cardiac medications before your next appointment, please call your pharmacy*   Lab Work: In 10 weeks: Lipids - please come fasting (Jan 6th-10th)  If you have labs (blood work) drawn today and your tests are completely normal, you will receive your results only by: MyChart Message (if you have MyChart) OR A paper copy in the mail If you have any lab test that is abnormal or we need to change your treatment, we will call you to review the results.   Testing/Procedures: Your physician has requested that you have a lexiscan myoview. For further information please visit [email or web address] Please follow instruction sheet, as given.   The test will take approximately 3 to 4 hours to complete; you may bring reading material.  If someone comes with you to your appointment, they will need to remain in the main lobby due to limited space in the testing area. **If you are pregnant or breastfeeding, please notify the nuclear lab prior to your appointment**  How to prepare for your Myocardial Perfusion Test: Do not eat or drink 3 hours prior to your test, except you may have water. Do not consume products containing caffeine (regular or decaffeinated) 12 hours prior to your test. (ex: coffee, chocolate, sodas, tea). Do bring a list of your current medications with you.  If not listed below, you may take your medications as normal. Do wear comfortable clothes (no dresses or overalls) and walking shoes, tennis shoes preferred (No heels or open toe shoes are allowed). Do NOT wear cologne, perfume, aftershave, or lotions (deodorant is allowed). If these instructions are not followed, your test will have to be rescheduled.    Follow-Up: At [facility], you and your health needs are our priority.  As part of our continuing  mission to provide you with exceptional heart care, we have created designated Provider Care Teams.  These Care Teams include your primary Cardiologist (physician) and Advanced Practice Providers (APPs -  Physician Assistants and Nurse Practitioners) who all work together to provide you with the care you need, when you need it.   Your next appointment:   6 month(s)  Provider:   Thomasene Ripple, DO

## 2023-06-06 NOTE — Progress Notes (Signed)
Cardiology Office Note:    Date:  06/10/2023   ID:  Teletha, Petrea [birth year], MRN [id number]  PCP:  Dana Allan, MD  Cardiologist:  Thomasene Ripple, DO  Electrophysiologist:  None   Referring MD: Dana Allan, MD   " I am   History of Present Illness:    Breanna Farrell is a 67 y.o. female a with a history of rheumatoid arthritis, and hypertension, Hypertriglyceridemia, obesity  and prediabetes presents with concerns about her upcoming knee surgery.   She has been experiencing significant pain and is worried about her high triglycerides. She has a history of multiple surgeries, including back surgery, hip replacement, and carpal tunnel surgery. She has been on heavy steroid use due to her autoimmune conditions and has gained weight as a result. 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 She reports that her labs are generally normal, except for her triglycerides. She is also concerned about her prediabetes, which was recently diagnosed.  Past Medical History:  Diagnosis Date   Abnormal cervical Pap smear with positive HPV DNA test 02/01/2012   Allergy    Tramadol   Anxiety    Arthritis 2014   rheumatoid   Articular cartilage disorder    bulging & herniated disc in lower back   Benign essential hypertension    Breast tenderness    Bronchitis 06/05/2017   Bulging discs 2012   Colitis 2007   ulcerative   Depression    Esophageal reflux    Family history of adverse reaction to anesthesia    MOM-N/V   Hernia 2007   hiatal   History of hiatal hernia    SMALL PER PT   Hypertension 2007   Pap smear abnormality of cervix with LGSIL 07/18/2012   BVD    Past Surgical History:  Procedure Laterality Date   ABDOMINAL HYSTERECTOMY  1986   [facility]   BACK SURGERY  12/27/2012   [facility]   BREAST BIOPSY Left 11/15/2017   x shape marker path pending UOQ   BREAST BIOPSY Left 11/15/2017   coil shape marker path  pending LOQ   CARPAL TUNNEL RELEASE Right 07/10/2017   Procedure: CARPAL TUNNEL RELEASE;  Surgeon: Juanell Fairly, MD;  Location: [facility];  Service: Orthopedics;  Laterality: Right;   CESAREAN SECTION  1981   CESAREAN SECTION  1981   [place]   CHOLECYSTECTOMY  2007   [place]   COLONOSCOPY  2010   COLONOSCOPY WITH PROPOFOL N/A 04/11/2018   Procedure: COLONOSCOPY WITH PROPOFOL;  Surgeon: Pasty Spillers, MD;  Location: [facility];  Service: Endoscopy;  Laterality: N/A;   COLPOSCOPY     ESOPHAGOGASTRODUODENOSCOPY (EGD) WITH PROPOFOL N/A 04/11/2018   Procedure: ESOPHAGOGASTRODUODENOSCOPY (EGD) WITH PROPOFOL;  Surgeon: Pasty Spillers, MD;  Location: [facility];  Service: Endoscopy;  Laterality: N/A;   JOINT REPLACEMENT  2023   Right hip replacement   POLYPECTOMY  2007   one   SPINE SURGERY  2022   Multi level fusion, laminectomy   UPPER GI ENDOSCOPY  2010    Current Medications: Current Meds  Medication Sig   amLODipine (NORVASC) 2.5 MG tablet Take 1 tablet (2.5 mg total) by mouth daily.   atorvastatin (LIPITOR) 10 MG tablet Take 1 tablet (10 mg total) by mouth daily.   Calcium Carbonate-Vit D-Min (CALCIUM 1200 PO) Take 1,200 mg by mouth daily.   celecoxib (CELEBREX) 100 MG capsule Take 100 mg by mouth as needed.  cetirizine (ZYRTEC) 10 MG tablet Take 10 mg by mouth daily.   Cholecalciferol (VITAMIN D-3) 1000 units CAPS Take 2,000 Units by mouth daily.    colestipol (COLESTID) 1 g tablet Take 1 g by mouth 2 (two) times daily.   folic acid (FOLVITE) 800 MCG tablet Take 800 mcg by mouth daily.   montelukast (SINGULAIR) 10 MG tablet Take 10 mg by mouth at bedtime.   Omega-3 Fatty Acids (SM FISH OIL) 1000 MG CAPS Take 1,000 mg by mouth daily.    pantoprazole (PROTONIX) 20 MG tablet Take 20 mg by mouth daily.   triamterene-hydrochlorothiazide (DYAZIDE) 37.5-25 MG capsule Take 1 each (1 capsule total) by mouth daily.   Upadacitinib ER (RINVOQ) 15 MG TB24 Take 15 mg by  mouth daily.   vitamin E 1000 UNIT capsule Take 1,000 Units by mouth daily.     Allergies:   Hepatitis b vaccine, Metoclopramide, Lyrica [pregabalin], and Tramadol   Social History   Socioeconomic History   Marital status: Married    Spouse name: Not on file   Number of children: Not on file   Years of education: Not on file   Highest education level: Associate degree: academic program  Occupational History   Not on file  Tobacco Use   Smoking status: Former    Current packs/day: 0.00    Average packs/day: 0.7 packs/day for 20.0 years (14.0 ttl pk-yrs)    Types: Cigarettes    Start date: 10/24/1988    Quit date: 10/24/2008    Years since quitting: 14.6   Smokeless tobacco: Never  Vaping Use   Vaping status: Never Used  Substance and Sexual Activity   Alcohol use: Not Currently    Comment: SOCIALLY   Drug use: No   Sexual activity: Yes    Birth control/protection: Post-menopausal  Other Topics Concern   Not on file  Social History Narrative   Not on file   Social Determinants of Health   Financial Resource Strain: Low Risk  (05/13/2023)   Overall Financial Resource Strain (CARDIA)    Difficulty of Paying Living Expenses: Not hard at all  Food Insecurity: No Food Insecurity (05/13/2023)   Hunger Vital Sign    Worried About Running Out of Food in the Last Year: Never true    Ran Out of Food in the Last Year: Never true  Transportation Needs: No Transportation Needs (05/13/2023)   PRAPARE - Administrator, Civil Service (Medical): No    Lack of Transportation (Non-Medical): No  Physical Activity: Unknown (05/13/2023)   Exercise Vital Sign    Days of Exercise per Week: 0 days    Minutes of Exercise per Session: Not on file  Stress: No Stress Concern Present (05/13/2023)   [employer] of Occupational Health - Occupational Stress Questionnaire    Feeling of Stress : Only a little  Social Connections: Moderately Integrated (05/13/2023)   Social Connection  and Isolation Panel [NHANES]    Frequency of Communication with Friends and Family: More than three times a week    Frequency of Social Gatherings with Friends and Family: More than three times a week    Attends Religious Services: More than 4 times per year    Active Member of [employer] or Organizations: No    Attends Engineer, structural: Not on file    Marital Status: Married     Family History: The patient's family history includes Arthritis in her mother; Heart disease in her maternal grandfather; Hypertension in  her mother; Kidney disease in her mother; Leukemia in her paternal grandmother; Lung cancer in her maternal grandmother; Melanoma in her mother; Ovarian cancer in her maternal aunt and maternal grandmother. There is no history of Breast cancer.  ROS:   Review of Systems  Constitution: Negative for decreased appetite, fever and weight gain.  HENT: Negative for congestion, ear discharge, hoarse voice and sore throat.   Eyes: Negative for discharge, redness, vision loss in right eye and visual halos.  Cardiovascular: Negative for chest pain, dyspnea on exertion, leg swelling, orthopnea and palpitations.  Respiratory: Negative for cough, hemoptysis, shortness of breath and snoring.   Endocrine: Negative for heat intolerance and polyphagia.  Hematologic/Lymphatic: Negative for bleeding problem. Does not bruise/bleed easily.  Skin: Negative for flushing, nail changes, rash and suspicious lesions.  Musculoskeletal: Negative for arthritis, joint pain, muscle cramps, myalgias, neck pain and stiffness.  Gastrointestinal: Negative for abdominal pain, bowel incontinence, diarrhea and excessive appetite.  Genitourinary: Negative for decreased libido, genital sores and incomplete emptying.  Neurological: Negative for brief paralysis, focal weakness, headaches and loss of balance.  Psychiatric/Behavioral: Negative for altered mental status, depression and suicidal ideas.   Allergic/Immunologic: Negative for HIV exposure and persistent infections.    EKGs/Labs/Other Studies Reviewed:    The following studies were reviewed today:   EKG:  The ekg ordered today demonstrates sinus   Recent Labs: 05/14/2023: ALT 19; BUN 14; Creatinine, Ser 0.79; Potassium 3.5; Sodium 138 05/28/2023: Hemoglobin 13.2; Platelets 284.0  Recent Lipid Panel    Component Value Date/Time   CHOL 293 (H) 05/14/2023 1013   TRIG (H) 05/14/2023 1013    883.0 Triglyceride is over 400; calculations on Lipids are invalid.   HDL 36.90 (L) 05/14/2023 1013   CHOLHDL 8 05/14/2023 1013   VLDL 78.6 (H) 03/16/2015 1144   LDLDIRECT 61.0 05/14/2023 1013    Physical Exam:    VS:  BP 112/82 (BP Location: Right Arm)   Pulse 79   Wt 197 lb 6.4 oz (89.5 kg)   SpO2 96%   BMI 33.88 kg/m     Wt Readings from Last 3 Encounters:  06/06/23 197 lb 6.4 oz (89.5 kg)  05/14/23 196 lb 8 oz (89.1 kg)  04/29/18 195 lb 12.8 oz (88.8 kg)     GEN: Well nourished, well developed in no acute distress HEENT: Normal NECK: No JVD; No carotid bruits LYMPHATICS: No lymphadenopathy CARDIAC: S1S2 noted,RRR, no murmurs, rubs, gallops RESPIRATORY:  Clear to auscultation without rales, wheezing or rhonchi  ABDOMEN: Soft, non-tender, non-distended, +bowel sounds, no guarding. EXTREMITIES: No edema, No cyanosis, no clubbing MUSCULOSKELETAL:  No deformity  SKIN: Warm and dry NEUROLOGIC:  Alert and oriented x 3, non-focal PSYCHIATRIC:  Normal affect, good insight  ASSESSMENT:    1. Nonspecific abnormal electrocardiogram (ECG) (EKG)   2. Essential hypertension   3. Pre-op evaluation   4. Hyperlipidemia, unspecified hyperlipidemia type   5. DOE (dyspnea on exertion)   6. Incomplete right bundle branch block (RBBB)   7. Prediabetes   8. Hypertriglyceridemia   9. Morbid obesity (HCC)    PLAN:     Dyslipidemia High triglycerides and low HDL, likely genetic.  -Start low dose Lipitor. -Check lipid panel  in 10 weeks to assess response to therapy.  Prediabetes Hemoglobin A1c of 6.2, indicating prediabetes. -Continue monitoring.  Preoperative Evaluation Patient scheduled for knee surgery on 08/25/2022. Significant dyslipidemia and prediabetes present. Unable to assess full METS and symptoms - limited due knee.  -Order stress test to  assess cardiac risk prior to surgery. Expedite if possible. -If stress test is positive, discuss next steps and potential delay of surgery.  Rheumatoid Arthritis Chronic condition, currently on Rinvoq.   Chronic Pain Severe pain due to multiple orthopedic issues and surgeries. -Continue current pain management plan. -Address pain control postoperatively.  Follow-up in 6 months to reassess dyslipidemia and prediabetes.  The patient is in agreement with the above plan. The patient left the office in stable condition.  The patient will follow up in   Medication Adjustments/Labs and Tests Ordered: Current medicines are reviewed at length with the patient today.  Concerns regarding medicines are outlined above.  Orders Placed This Encounter  Procedures   Lipid panel   MYOCARDIAL PERFUSION IMAGING   EKG 12-Lead   Meds ordered this encounter  Medications   atorvastatin (LIPITOR) 10 MG tablet    Sig: Take 1 tablet (10 mg total) by mouth daily.    Dispense:  90 tablet    Refill:  3    Patient Instructions  Medication Instructions:  Your physician has recommended you make the following change in your medication:  START: Lipitor 10 mg once daily *If you need a refill on your cardiac medications before your next appointment, please call your pharmacy*   Lab Work: In 10 weeks: Lipids - please come fasting (Jan 6th-10th)  If you have labs (blood work) drawn today and your tests are completely normal, you will receive your results only by: MyChart Message (if you have MyChart) OR A paper copy in the mail If you have any lab test that is abnormal or we  need to change your treatment, we will call you to review the results.   Testing/Procedures: Your physician has requested that you have a lexiscan myoview. For further information please visit [email or web address] Please follow instruction sheet, as given.   The test will take approximately 3 to 4 hours to complete; you may bring reading material.  If someone comes with you to your appointment, they will need to remain in the main lobby due to limited space in the testing area. **If you are pregnant or breastfeeding, please notify the nuclear lab prior to your appointment**  How to prepare for your Myocardial Perfusion Test: Do not eat or drink 3 hours prior to your test, except you may have water. Do not consume products containing caffeine (regular or decaffeinated) 12 hours prior to your test. (ex: coffee, chocolate, sodas, tea). Do bring a list of your current medications with you.  If not listed below, you may take your medications as normal. Do wear comfortable clothes (no dresses or overalls) and walking shoes, tennis shoes preferred (No heels or open toe shoes are allowed). Do NOT wear cologne, perfume, aftershave, or lotions (deodorant is allowed). If these instructions are not followed, your test will have to be rescheduled.    Follow-Up: At [facility], you and your health needs are our priority.  As part of our continuing mission to provide you with exceptional heart care, we have created designated Provider Care Teams.  These Care Teams include your primary Cardiologist (physician) and Advanced Practice Providers (APPs -  Physician Assistants and Nurse Practitioners) who all work together to provide you with the care you need, when you need it.   Your next appointment:   6 month(s)  Provider:   Thomasene Ripple, DO      Adopting a Healthy Lifestyle.  Know what a healthy weight is for you (roughly BMI <25) and  aim to maintain this   Aim for 7+ servings of fruits and  vegetables daily   65-80+ fluid ounces of water or unsweet tea for healthy kidneys   Limit to max 1 drink of alcohol per day; avoid smoking/tobacco   Limit animal fats in diet for cholesterol and heart health - choose grass fed whenever available   Avoid highly processed foods, and foods high in saturated/trans fats   Aim for low stress - take time to unwind and care for your mental health   Aim for 150 min of moderate intensity exercise weekly for heart health, and weights twice weekly for bone health   Aim for 7-9 hours of sleep daily   When it comes to diets, agreement about the perfect plan isnt easy to find, even among the experts. Experts at the [facility] [employer] developed an idea known as the Healthy Eating Plate. Just imagine a plate divided into logical, healthy portions.   The emphasis is on diet quality:   Load up on vegetables and fruits - one-half of your plate: Aim for color and variety, and remember that potatoes dont count.   Go for whole grains - one-quarter of your plate: Whole wheat, barley, wheat berries, quinoa, oats, brown rice, and foods made with them. If you want pasta, go with whole wheat pasta.   Protein power - one-quarter of your plate: Fish, chicken, beans, and nuts are all healthy, versatile protein sources. Limit red meat.   The diet, however, does go beyond the plate, offering a few other suggestions.   Use healthy plant oils, such as olive, canola, soy, corn, sunflower and peanut. Check the labels, and avoid partially hydrogenated oil, which have unhealthy trans fats.   If youre thirsty, drink water. Coffee and tea are good in moderation, but skip sugary drinks and limit milk and dairy products to one or two daily servings.   The type of carbohydrate in the diet is more important than the amount. Some sources of carbohydrates, such as vegetables, fruits, whole grains, and beans-are healthier than others.   Finally, stay  active  Signed, Thomasene Ripple, DO  06/10/2023 3:17 PM    [facility]

## 2023-06-08 ENCOUNTER — Telehealth (HOSPITAL_COMMUNITY): Payer: Self-pay | Admitting: *Deleted

## 2023-06-08 NOTE — Telephone Encounter (Signed)
Patient given detailed instructions per Myocardial Perfusion Study Information Sheet for the test on 06/11/2023 at 7:30. Patient notified to arrive 15 minutes early and that it is imperative to arrive on time for appointment to keep from having the test rescheduled.  If you need to cancel or reschedule your appointment, please call the office within 24 hours of your appointment. . Patient verbalized understanding.Breanna Farrell

## 2023-06-11 ENCOUNTER — Ambulatory Visit (HOSPITAL_COMMUNITY): Payer: Medicare Other | Attending: Cardiology

## 2023-06-11 DIAGNOSIS — Z01810 Encounter for preprocedural cardiovascular examination: Secondary | ICD-10-CM | POA: Insufficient documentation

## 2023-06-11 DIAGNOSIS — R0609 Other forms of dyspnea: Secondary | ICD-10-CM | POA: Insufficient documentation

## 2023-06-11 DIAGNOSIS — Z01818 Encounter for other preprocedural examination: Secondary | ICD-10-CM | POA: Insufficient documentation

## 2023-06-11 LAB — MYOCARDIAL PERFUSION IMAGING
LV dias vol: 60 mL (ref 46–106)
LV sys vol: 15 mL
Nuc Stress EF: 75 %
Peak HR: 99 {beats}/min
Rest HR: 71 {beats}/min
Rest Nuclear Isotope Dose: 8.5 mCi
SDS: 2
SRS: 0
SSS: 2
ST Depression (mm): 0 mm
Stress Nuclear Isotope Dose: 29.1 mCi
TID: 1.15

## 2023-06-11 MED ORDER — REGADENOSON 0.4 MG/5ML IV SOLN
0.4000 mg | Freq: Once | INTRAVENOUS | Status: AC
  Administered 2023-06-11: 0.4 mg via INTRAVENOUS

## 2023-06-11 MED ORDER — TECHNETIUM TC 99M TETROFOSMIN IV KIT
8.5000 | PACK | Freq: Once | INTRAVENOUS | Status: AC | PRN
  Administered 2023-06-11: 8.5 via INTRAVENOUS

## 2023-06-11 MED ORDER — TECHNETIUM TC 99M TETROFOSMIN IV KIT
29.1000 | PACK | Freq: Once | INTRAVENOUS | Status: AC | PRN
Start: 2023-06-11 — End: 2023-06-11
  Administered 2023-06-11: 29.1 via INTRAVENOUS

## 2023-06-12 ENCOUNTER — Encounter: Payer: Self-pay | Admitting: Family Medicine

## 2023-06-13 DIAGNOSIS — M1712 Unilateral primary osteoarthritis, left knee: Secondary | ICD-10-CM | POA: Insufficient documentation

## 2023-06-14 ENCOUNTER — Telehealth: Payer: Self-pay

## 2023-06-14 ENCOUNTER — Telehealth: Payer: Self-pay | Admitting: Cardiology

## 2023-06-14 NOTE — Telephone Encounter (Signed)
Result released to Mychart. Seen by patient Breanna Farrell on 06/14/2023 12:00 PM

## 2023-06-14 NOTE — Telephone Encounter (Signed)
Patient is calling for results of stress test.  °

## 2023-06-14 NOTE — Telephone Encounter (Signed)
Patient states she is following up with [place] to see if we have completed her medical clearance form.  Patient states she would like for [place] to please complete this as soon as possible and send it back to [facility].  Patient states she is in a lot of pain and wants to be sure her surgery is not cancelled.  I let patient know that Dr. Dana Allan was out of the office yesterday and is out today, but I will send a message to her nurse.

## 2023-06-14 NOTE — Telephone Encounter (Signed)
Error

## 2023-06-14 NOTE — Telephone Encounter (Signed)
Patient is calling for results to her stress test.

## 2023-06-18 ENCOUNTER — Telehealth: Payer: Self-pay | Admitting: Cardiology

## 2023-06-18 NOTE — Telephone Encounter (Signed)
[facility] called and is looking for Medical Clearance.

## 2023-06-18 NOTE — Telephone Encounter (Signed)
noted 

## 2023-06-18 NOTE — Telephone Encounter (Signed)
Patient just called and she needs all her labs sent over to [facility] so she can be clear for her surgery. The fax number is [phone]. Her surgery is next Tuesday. She said she just need clearance. The patient would like to be called. Her number is [phone]

## 2023-06-18 NOTE — Telephone Encounter (Signed)
Faxed clearance letter to [employer] via epic. Fax confirmation can be viewed under misc reports (chart routing history). Called patient and made her aware letter was sent.

## 2023-06-18 NOTE — Telephone Encounter (Signed)
Breanna Farrell called from [facility] requesting a callback at [phone] ext [phone] from nurse regarding preop clearance form being faxed back to them to [phone]. Pt's procedure date is the 19th and she stated she spoke with someone last week but she hadn't heard anything then. Please advise.

## 2023-06-18 NOTE — Telephone Encounter (Signed)
Faxed over to [facility].

## 2023-07-08 ENCOUNTER — Encounter: Payer: Self-pay | Admitting: Family Medicine

## 2023-07-10 ENCOUNTER — Other Ambulatory Visit: Payer: Self-pay | Admitting: Family Medicine

## 2023-07-10 DIAGNOSIS — E1159 Type 2 diabetes mellitus with other circulatory complications: Secondary | ICD-10-CM

## 2023-07-10 DIAGNOSIS — I10 Essential (primary) hypertension: Secondary | ICD-10-CM

## 2023-07-10 MED ORDER — TRIAMTERENE-HCTZ 37.5-25 MG PO CAPS: 1.0000 | ORAL_CAPSULE | Freq: Every day | ORAL | 0 refills | Status: DC

## 2023-07-13 DIAGNOSIS — M25562 Pain in left knee: Secondary | ICD-10-CM | POA: Insufficient documentation

## 2023-08-09 ENCOUNTER — Telehealth: Payer: Self-pay | Admitting: Family Medicine

## 2023-08-09 NOTE — Telephone Encounter (Signed)
 Copied from CRM [id number]. Topic: Medicare AWV >> Aug 09, 2023 11:34 AM Nathanel DEL wrote: Reason for CRM: Called LVM 08/09/2023 to schedule AWV. Please schedule office or virtual visits.  Nathanel Paschal; Care Guide Ambulatory Clinical Support [place] [facility] Direct Dial: [phone]

## 2023-08-14 ENCOUNTER — Ambulatory Visit: Payer: Medicare Other | Admitting: Family Medicine

## 2023-08-29 ENCOUNTER — Telehealth: Payer: Self-pay | Admitting: Family Medicine

## 2023-08-29 NOTE — Telephone Encounter (Signed)
Copied from CRM [id number]. Topic: Medicare AWV >> Aug 29, 2023 11:25 AM Payton Doughty wrote: Reason for CRM: Called LVM 08/29/2023 to schedule AWV. Please schedule office or virtual visits.  Verlee Rossetti; Care Guide Ambulatory Clinical Support [place] [facility] Direct Dial: [phone]

## 2023-09-10 ENCOUNTER — Ambulatory Visit: Payer: Medicare Other | Admitting: Family Medicine

## 2023-09-30 ENCOUNTER — Telehealth: Payer: Medicare Other | Admitting: Physician Assistant

## 2023-09-30 DIAGNOSIS — J069 Acute upper respiratory infection, unspecified: Secondary | ICD-10-CM | POA: Diagnosis not present

## 2023-09-30 MED ORDER — AZELASTINE HCL 0.1 % NA SOLN: 2.0000 | Freq: Two times a day (BID) | NASAL | 12 refills | Status: DC

## 2023-09-30 MED ORDER — FLUTICASONE PROPIONATE 50 MCG/ACT NA SUSP: 2.0000 | Freq: Every day | NASAL | 6 refills | Status: DC

## 2023-09-30 NOTE — Progress Notes (Signed)
 Virtual Visit Consent   Breanna Farrell, you are scheduled for a virtual visit with a [facility] provider today. Just as with appointments in the office, your consent must be obtained to participate. Your consent will be active for this visit and any virtual visit you may have with one of our providers in the next 365 days. If you have a MyChart account, a copy of this consent can be sent to you electronically.  As this is a virtual visit, video technology does not allow for your provider to perform a traditional examination. This may limit your provider's ability to fully assess your condition. If your provider identifies any concerns that need to be evaluated in person or the need to arrange testing (such as labs, EKG, etc.), we will make arrangements to do so. Although advances in technology are sophisticated, we cannot ensure that it will always work on either your end or our end. If the connection with a video visit is poor, the visit may have to be switched to a telephone visit. With either a video or telephone visit, we are not always able to ensure that we have a secure connection.  By engaging in this virtual visit, you consent to the provision of healthcare and authorize for your insurance to be billed (if applicable) for the services provided during this visit. Depending on your insurance coverage, you may receive a charge related to this service.  I need to obtain your verbal consent now. Are you willing to proceed with your visit today? Breanna Farrell has provided verbal consent on 09/30/2023 for a virtual visit (video or telephone). Laure Kidney, [place]  Date: 09/30/2023 11:13 AM   Virtual Visit via Video Note   I, Laure Kidney, connected with  Breanna Farrell  ([id number], 10-May-1956) on 09/30/23 at 11:15 AM EST by a video-enabled telemedicine application and verified that I am speaking with the correct person using two identifiers.  Location: Patient: Virtual Visit Location Patient:  Home Provider: Virtual Visit Location Provider: Home Office   I discussed the limitations of evaluation and management by telemedicine and the availability of in person appointments. The patient expressed understanding and agreed to proceed.    History of Present Illness: Breanna Farrell is a 68 y.o. who identifies as a female who was assigned female at birth, and is being seen today for 1 day history of sinus issues.  HPI: Sinus Problem This is a new problem. The current episode started today. There has been no fever. She is experiencing no pain. Associated symptoms include congestion. Past treatments include saline nose sprays. The treatment provided mild relief.    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   Abdominal pain, chronic, epigastric    Stomach irritation    Intractable vomiting with nausea    Special screening for malignant neoplasms, colon    External hemorrhoids    Angiodysplasia of intestinal tract    Benign neoplasm of descending colon    Benign neoplasm of cecum    Rectal polyp    Polyp of sigmoid colon  Epigastric discomfort 03/26/2018   Osteoporosis 02/27/2018   Leukocytosis 09/14/2017   Elevated LFTs 09/14/2017   Carpal tunnel syndrome 07/18/2017   Lower extremity edema 01/26/2017   Anxiety and depression 01/26/2017   GERD (gastroesophageal reflux disease) 11/07/2016   Psoriatic arthritis (HCC) 03/16/2015   History of osteoporosis 01/06/2015   Hyperlipidemia associated with type 2 diabetes mellitus (HCC)  01/06/2015   Essential hypertension 01/06/2015   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/06/2015    Allergies:  Allergies  Allergen Reactions   Hepatitis B Vaccine Anaphylaxis   Metoclopramide Rash   Lyrica [Pregabalin] Nausea And Vomiting and Rash   Tramadol Nausea And Vomiting, Rash and Other (See Comments)    Clammy and sweaty   Medications:  Current Outpatient Medications:    amLODipine (NORVASC) 2.5 MG tablet, Take 1 tablet (2.5 mg total) by mouth daily., Disp: 90 tablet, Rfl: 1   atorvastatin (LIPITOR) 10 MG tablet, Take 1 tablet (10 mg total) by mouth daily., Disp: 90 tablet, Rfl: 3   Calcium Carbonate-Vit D-Min (CALCIUM 1200 PO), Take 1,200 mg by mouth daily., Disp: , Rfl:    celecoxib (CELEBREX) 100 MG capsule, Take 100 mg by mouth as needed., Disp: , Rfl:    cetirizine (ZYRTEC) 10 MG tablet, Take 10 mg by mouth daily., Disp: , Rfl:    Cholecalciferol (VITAMIN D-3) 1000 units CAPS, Take 2,000 Units by mouth daily. , Disp: , Rfl:    colestipol (COLESTID) 1 g tablet, Take 1 g by mouth 2 (two) times daily., Disp: , Rfl:    folic acid (FOLVITE) 800 MCG tablet, Take 800 mcg by mouth daily., Disp: , Rfl:    montelukast (SINGULAIR) 10 MG tablet, Take 10 mg by mouth at bedtime., Disp: , Rfl:    Omega-3 Fatty Acids (SM FISH OIL) 1000 MG CAPS, Take 1,000 mg by mouth daily. , Disp: , Rfl:    pantoprazole (PROTONIX) 20 MG tablet, Take 20 mg by mouth daily., Disp: , Rfl:    triamterene-hydrochlorothiazide (DYAZIDE) 37.5-25 MG capsule, Take 1 each (1 capsule total) by mouth daily., Disp: 90 capsule, Rfl: 0   Upadacitinib ER (RINVOQ) 15 MG TB24, Take 15 mg by mouth daily., Disp: , Rfl:    vitamin E 1000 UNIT capsule, Take 1,000 Units by mouth daily., Disp: , Rfl:   Observations/Objective: Patient is well-developed, well-nourished in no acute distress.  Resting comfortably  at home.  Head is normocephalic, atraumatic.  No labored breathing.  Speech is clear and coherent with  logical content.  Patient is alert and oriented at baseline.    Assessment and Plan: 1. Upper respiratory tract infection, unspecified type (Primary)  Patient presents with symptoms suspicious for likely viral uri . Differentials include bacterial pneumonia, sinusitis, allergic rhinitis. Do not suspect underlying cardiopulmonary process. I considered, but think unlikely, dangerous causes of this patient's symptoms to include ACS, CHF or COPD exacerbations, pneumonia, pneumothorax. Patient is nontoxic appearing and not in need of emergent medical intervention.  Plan: reassurance, reassessment, over the counter medications, discharge with PCP follow-up  Follow Up Instructions: I discussed the assessment and treatment plan with the patient. The patient was provided an opportunity to ask questions and all were answered. The patient agreed with the plan and demonstrated an understanding of the instructions.  A copy of instructions were sent to the patient via MyChart unless otherwise noted below.    The patient was advised to call back or seek an in-person evaluation if the symptoms worsen or if  the condition fails to improve as anticipated.    Laure Kidney, PA-C

## 2023-09-30 NOTE — Patient Instructions (Signed)
 Breanna Farrell, thank you for joining Laure Kidney, PA-C for today's virtual visit.  While this provider is not your primary care provider (PCP), if your PCP is located in our provider database this encounter information will be shared with them immediately following your visit.   A [facility] MyChart account gives you access to today's visit and all your visits, tests, and labs performed at [facility] " click here if you don't have a [facility] MyChart account or go to [email or web address]  Consent: (Patient) Breanna Farrell provided verbal consent for this virtual visit at the beginning of the encounter.  Current Medications:  Current Outpatient Medications:    azelastine (ASTELIN) 0.1 % nasal spray, Place 2 sprays into both nostrils 2 (two) times daily. Use in each nostril as directed, Disp: 30 mL, Rfl: 12   fluticasone (FLONASE) 50 MCG/ACT nasal spray, Place 2 sprays into both nostrils daily., Disp: 16 g, Rfl: 6   amLODipine (NORVASC) 2.5 MG tablet, Take 1 tablet (2.5 mg total) by mouth daily., Disp: 90 tablet, Rfl: 1   atorvastatin (LIPITOR) 10 MG tablet, Take 1 tablet (10 mg total) by mouth daily., Disp: 90 tablet, Rfl: 3   Calcium Carbonate-Vit D-Min (CALCIUM 1200 PO), Take 1,200 mg by mouth daily., Disp: , Rfl:    celecoxib (CELEBREX) 100 MG capsule, Take 100 mg by mouth as needed., Disp: , Rfl:    cetirizine (ZYRTEC) 10 MG tablet, Take 10 mg by mouth daily., Disp: , Rfl:    Cholecalciferol (VITAMIN D-3) 1000 units CAPS, Take 2,000 Units by mouth daily. , Disp: , Rfl:    colestipol (COLESTID) 1 g tablet, Take 1 g by mouth 2 (two) times daily., Disp: , Rfl:    folic acid (FOLVITE) 800 MCG tablet, Take 800 mcg by mouth daily., Disp: , Rfl:    montelukast (SINGULAIR) 10 MG tablet, Take 10 mg by mouth at bedtime., Disp: , Rfl:    Omega-3 Fatty Acids (SM FISH OIL) 1000 MG CAPS, Take 1,000 mg by mouth daily. , Disp: , Rfl:    pantoprazole (PROTONIX) 20 MG tablet, Take  20 mg by mouth daily., Disp: , Rfl:    triamterene-hydrochlorothiazide (DYAZIDE) 37.5-25 MG capsule, Take 1 each (1 capsule total) by mouth daily., Disp: 90 capsule, Rfl: 0   Upadacitinib ER (RINVOQ) 15 MG TB24, Take 15 mg by mouth daily., Disp: , Rfl:    vitamin E 1000 UNIT capsule, Take 1,000 Units by mouth daily., Disp: , Rfl:    Medications ordered in this encounter:  Meds ordered this encounter  Medications   fluticasone (FLONASE) 50 MCG/ACT nasal spray    Sig: Place 2 sprays into both nostrils daily.    Dispense:  16 g    Refill:  6    Supervising Provider:   Merrilee Jansky [[id number]]   azelastine (ASTELIN) 0.1 % nasal spray    Sig: Place 2 sprays into both nostrils 2 (two) times daily. Use in each nostril as directed    Dispense:  30 mL    Refill:  12    Supervising Provider:   Merrilee Jansky [[id number]]     *If you need refills on other medications prior to your next appointment, please contact your pharmacy*  Follow-Up: Call back or seek an in-person evaluation if the symptoms worsen or if the condition fails to improve as anticipated.  [facility] [phone]  Other Instructions Please report to the nearest Emergency room with any worsening symptoms.  Follow up with primary care provider (PCP) in 2 -3 days.    If you have been instructed to have an in-person evaluation today at a local Urgent Care facility, please use the link below. It will take you to a list of all of our available [facility], including address, phone number and hours of operation. Please do not delay care.  [facility]  If you or a family member do not have a primary care provider, use the link below to schedule a visit and establish care. When you choose a [facility] primary care physician or advanced practice provider, you gain a long-term partner in health. Find a Primary Care Provider  Learn more about [facility] [place]'s in-office and virtual care  options: [facility] - Get Care Now

## 2023-10-08 ENCOUNTER — Ambulatory Visit
Admission: EM | Admit: 2023-10-08 | Discharge: 2023-10-08 | Disposition: A | Discharge status: Home / Self Care | Attending: Emergency Medicine | Admitting: Emergency Medicine

## 2023-10-08 DIAGNOSIS — J0100 Acute maxillary sinusitis, unspecified: Secondary | ICD-10-CM | POA: Diagnosis not present

## 2023-10-08 HISTORY — DX: Rheumatoid arthritis, unspecified: M06.9

## 2023-10-08 HISTORY — DX: Arthropathic psoriasis, unspecified: L40.50

## 2023-10-08 MED ORDER — AMOXICILLIN-POT CLAVULANATE 875-125 MG PO TABS
1.0000 | ORAL_TABLET | Freq: Two times a day (BID) | ORAL | 0 refills | Status: DC
Start: 2023-10-08 — End: 2023-10-25

## 2023-10-08 NOTE — ED Triage Notes (Signed)
 Pt reports congestion, cough, lethargy, HA, intermittent sore throat and hoarseness x 8 days. Congestion is tight.  Breathing feels tight. Swelling in sinus area. States everything hits her harder d/t RA and that she needs abx to knock out illness, even when told its viral.

## 2023-10-08 NOTE — Discharge Instructions (Addendum)
 Take the Augmentin as directed.  Follow up with your primary care provider if your symptoms are not improving.

## 2023-10-08 NOTE — ED Provider Notes (Signed)
 Renaldo Fiddler    CSN: [id number] Arrival date & time: 10/08/23  1145      History   Chief Complaint Chief Complaint  Patient presents with   Cough   Headache    HPI Breanna Farrell is a 68 y.o. female.  Patient presents with 8-day history of sinus congestion, postnasal drip, runny nose, sore throat, cough, headache, fatigue.  Treatment attempted with OTC cold medication.  No fever or shortness of breath.  Patient had a telehealth visit on 09/30/2023; diagnosed with URI; treated with azelastine nasal spray and fluticasone nasal spray.  The history is provided by the patient and medical records.    Past Medical History:  Diagnosis Date   Abnormal cervical Pap smear with positive HPV DNA test 02/01/2012   Allergy    Tramadol   Anxiety    Arthritis 2014   rheumatoid   Articular cartilage disorder    bulging & herniated disc in lower back   Benign essential hypertension    Breast tenderness    Bronchitis 06/05/2017   Bulging discs 2012   Colitis 2007   ulcerative   Depression    Esophageal reflux    Family history of adverse reaction to anesthesia    MOM-N/V   Hernia 2007   hiatal   History of hiatal hernia    SMALL PER PT   Hypertension 2007   Pap smear abnormality of cervix with LGSIL 07/18/2012   BVD   Psoriatic arthritis (HCC)    Rheumatoid arthritis (HCC)     Patient Active Problem List   Diagnosis Date Noted   SLE (systemic lupus erythematosus) (HCC) 05/19/2023   RA (rheumatoid arthritis) (HCC) 05/19/2023   Hypertension associated with diabetes (HCC) 05/19/2023   Vitamin D deficiency 05/19/2023   Vitamin B 12 deficiency 05/19/2023   Need for hepatitis C screening test 05/19/2023   Breast cancer screening by mammogram 05/19/2023   Hypertriglyceridemia 05/19/2023   Osteoarthritis 05/19/2023   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   Abdominal pain, chronic, epigastric    Stomach irritation    Intractable vomiting with nausea    Special  screening for malignant neoplasms, colon    External hemorrhoids    Angiodysplasia of intestinal tract    Benign neoplasm of descending colon    Benign neoplasm of cecum    Rectal polyp    Polyp of sigmoid colon    Epigastric discomfort 03/26/2018   Osteoporosis 02/27/2018   Leukocytosis 09/14/2017   Elevated LFTs 09/14/2017   Carpal tunnel syndrome 07/18/2017   Lower extremity edema 01/26/2017   Anxiety and depression 01/26/2017   GERD (gastroesophageal reflux disease) 11/07/2016   Psoriatic arthritis (HCC) 03/16/2015   History of osteoporosis 01/06/2015   Hyperlipidemia associated with type 2 diabetes mellitus (HCC) 01/06/2015   Essential hypertension 01/06/2015   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/06/2015    Past Surgical History:  Procedure Laterality Date   ABDOMINAL HYSTERECTOMY  1986   [facility]   BACK SURGERY  12/27/2012   [facility]   BREAST BIOPSY Left 11/15/2017   x shape marker path pending UOQ   BREAST BIOPSY Left 11/15/2017   coil shape marker path pending LOQ   CARPAL TUNNEL RELEASE Right 07/10/2017   Procedure: CARPAL TUNNEL RELEASE;  Surgeon: Juanell Fairly, MD;  Location: [facility] ORS;  Service: Orthopedics;  Laterality: Right;   CESAREAN SECTION  1981   CESAREAN SECTION  1981   [place]   CHOLECYSTECTOMY  2007  [place]   COLONOSCOPY  2010   COLONOSCOPY WITH PROPOFOL N/A 04/11/2018   Procedure: COLONOSCOPY WITH PROPOFOL;  Surgeon: Pasty Spillers, MD;  Location: [facility] ENDOSCOPY;  Service: Endoscopy;  Laterality: N/A;   COLPOSCOPY     ESOPHAGOGASTRODUODENOSCOPY (EGD) WITH PROPOFOL N/A 04/11/2018   Procedure: ESOPHAGOGASTRODUODENOSCOPY (EGD) WITH PROPOFOL;  Surgeon: Pasty Spillers, MD;  Location: [facility] ENDOSCOPY;  Service: Endoscopy;  Laterality: N/A;   JOINT REPLACEMENT  2023   Right hip replacement   POLYPECTOMY  2007   one   SPINE SURGERY  2022   Multi level fusion, laminectomy   UPPER GI ENDOSCOPY  2010     OB History   No obstetric history on file.      Home Medications    Prior to Admission medications   Medication Sig Start Date End Date Taking? Authorizing Provider  amoxicillin-clavulanate (AUGMENTIN) 875-125 MG tablet Take 1 tablet by mouth every 12 (twelve) hours. 10/08/23  Yes Mickie Bail, NP  amLODipine (NORVASC) 2.5 MG tablet Take 1 tablet (2.5 mg total) by mouth daily. 04/29/18   Tracey Harries, FNP  atorvastatin (LIPITOR) 10 MG tablet Take 1 tablet (10 mg total) by mouth daily. 06/06/23 09/04/23  Tobb, Kardie, DO  azelastine (ASTELIN) 0.1 % nasal spray Place 2 sprays into both nostrils 2 (two) times daily. Use in each nostril as directed 09/30/23   Laure Kidney, PA-C  Calcium Carbonate-Vit D-Min (CALCIUM 1200 PO) Take 1,200 mg by mouth daily.    [provider]  celecoxib (CELEBREX) 100 MG capsule Take 100 mg by mouth as needed. 01/17/23   [provider]  cetirizine (ZYRTEC) 10 MG tablet Take 10 mg by mouth daily.    [provider]  Cholecalciferol (VITAMIN D-3) 1000 units CAPS Take 2,000 Units by mouth daily.     [provider]  colestipol (COLESTID) 1 g tablet Take 1 g by mouth 2 (two) times daily.    [provider]  fluticasone (FLONASE) 50 MCG/ACT nasal spray Place 2 sprays into both nostrils daily. 09/30/23   Laure Kidney, PA-C  folic acid (FOLVITE) 800 MCG tablet Take 800 mcg by mouth daily.    [provider]  montelukast (SINGULAIR) 10 MG tablet Take 10 mg by mouth at bedtime. 01/26/20   [provider]  Omega-3 Fatty Acids (SM FISH OIL) 1000 MG CAPS Take 1,000 mg by mouth daily.     [provider]  pantoprazole (PROTONIX) 20 MG tablet Take 20 mg by mouth daily. 01/26/20   [provider]  triamterene-hydrochlorothiazide (DYAZIDE) 37.5-25 MG capsule Take 1 each (1 capsule total) by mouth daily. 07/10/23   Dana Allan, MD  Upadacitinib ER (RINVOQ) 15 MG TB24 Take 15 mg by mouth daily.  09/14/22   [provider]  vitamin E 1000 UNIT capsule Take 1,000 Units by mouth daily. 01/26/20   [provider]    Family History Family History  Problem Relation Age of Onset   Melanoma Mother    Hypertension Mother    Arthritis Mother    Kidney disease Mother    Ovarian cancer Maternal Aunt    Lung cancer Maternal Grandmother        Great Grandmother   Ovarian cancer Maternal Grandmother        Great Grandmother   Heart disease Maternal Grandfather    Leukemia Paternal Grandmother    Breast cancer Neg Hx     Social History Social History   Tobacco Use  Smoking status: Former    Current packs/day: 0.00    Average packs/day: 0.7 packs/day for 20.0 years (14.0 ttl pk-yrs)    Types: Cigarettes    Start date: 10/24/1988    Quit date: 10/24/2008    Years since quitting: 14.9   Smokeless tobacco: Never  Vaping Use   Vaping status: Never Used  Substance Use Topics   Alcohol use: Not Currently    Comment: SOCIALLY   Drug use: No     Allergies   Hepatitis b vaccine, Metoclopramide, Lyrica [pregabalin], and Tramadol   Review of Systems Review of Systems  Constitutional:  Positive for fatigue. Negative for chills and fever.  HENT:  Positive for congestion, postnasal drip, rhinorrhea, sinus pressure and sore throat. Negative for ear pain.   Respiratory:  Positive for cough. Negative for shortness of breath.   Neurological:  Positive for headaches.     Physical Exam Triage Vital Signs ED Triage Vitals  Encounter Vitals Group     BP 10/08/23 1223 139/79     Systolic BP Percentile --      Diastolic BP Percentile --      Pulse Rate 10/08/23 1223 83     Resp 10/08/23 1223 18     Temp 10/08/23 1223 98.8 F (37.1 C)     Temp src --      SpO2 10/08/23 1223 95 %     Weight --      Height --      Head Circumference --      Peak Flow --      Pain Score 10/08/23 1229 5     Pain Loc --      Pain Education --      Exclude from Growth Chart --     No data found.  Updated Vital Signs BP 139/79   Pulse 83   Temp 98.8 F (37.1 C)   Resp 18   SpO2 95%   Visual Acuity Right Eye Distance:   Left Eye Distance:   Bilateral Distance:    Right Eye Near:   Left Eye Near:    Bilateral Near:     Physical Exam Constitutional:      General: She is not in acute distress. HENT:     Right Ear: Tympanic membrane normal.     Left Ear: Tympanic membrane normal.     Nose: Congestion and rhinorrhea present.     Mouth/Throat:     Mouth: Mucous membranes are moist.     Pharynx: Oropharynx is clear.  Cardiovascular:     Rate and Rhythm: Normal rate and regular rhythm.     Heart sounds: Normal heart sounds.  Pulmonary:     Effort: Pulmonary effort is normal. No respiratory distress.     Breath sounds: Normal breath sounds.  Neurological:     Mental Status: She is alert.      UC Treatments / Results  Labs (all labs ordered are listed, but only abnormal results are displayed) Labs Reviewed - No data to display  EKG   Radiology No results found.  Procedures Procedures (including critical care time)  Medications Ordered in UC Medications - No data to display  Initial Impression / Assessment and Plan / UC Course  I have reviewed the triage vital signs and the nursing notes.  Pertinent labs & imaging results that were available during my care of the patient were reviewed by me and considered in my medical decision making (see chart for details).  Acute sinusitis.  Patient has been symptomatic for 8 days.  Treating today with Augmentin.  Tylenol or ibuprofen as needed.  Plain Mucinex as needed.  Instructed patient to follow up with her PCP if her symptoms are not improving.  She agrees to plan of care.   Final Clinical Impressions(s) / UC Diagnoses   Final diagnoses:  Acute non-recurrent maxillary sinusitis     Discharge Instructions      Take the Augmentin as directed.  Follow-up with your primary care provider  if your symptoms are not improving.      ED Prescriptions     Medication Sig Dispense Auth. Provider   amoxicillin-clavulanate (AUGMENTIN) 875-125 MG tablet Take 1 tablet by mouth every 12 (twelve) hours. 14 tablet Mickie Bail, NP      PDMP not reviewed this encounter.   Mickie Bail, NP 10/08/23 1259

## 2023-10-25 ENCOUNTER — Encounter: Payer: Self-pay | Admitting: Family Medicine

## 2023-10-25 ENCOUNTER — Ambulatory Visit (INDEPENDENT_AMBULATORY_CARE_PROVIDER_SITE_OTHER): Payer: Medicare Other | Admitting: Family Medicine

## 2023-10-25 VITALS — BP 118/78 | HR 92 | Temp 98.2°F | Resp 20 | Ht 64.0 in | Wt 194.2 lb

## 2023-10-25 DIAGNOSIS — M810 Age-related osteoporosis without current pathological fracture: Secondary | ICD-10-CM | POA: Diagnosis not present

## 2023-10-25 DIAGNOSIS — M329 Systemic lupus erythematosus, unspecified: Secondary | ICD-10-CM

## 2023-10-25 DIAGNOSIS — E781 Pure hyperglyceridemia: Secondary | ICD-10-CM

## 2023-10-25 DIAGNOSIS — I152 Hypertension secondary to endocrine disorders: Secondary | ICD-10-CM

## 2023-10-25 DIAGNOSIS — E1159 Type 2 diabetes mellitus with other circulatory complications: Secondary | ICD-10-CM | POA: Diagnosis not present

## 2023-10-25 DIAGNOSIS — E785 Hyperlipidemia, unspecified: Secondary | ICD-10-CM

## 2023-10-25 DIAGNOSIS — M069 Rheumatoid arthritis, unspecified: Secondary | ICD-10-CM

## 2023-10-25 DIAGNOSIS — E118 Type 2 diabetes mellitus with unspecified complications: Secondary | ICD-10-CM

## 2023-10-25 DIAGNOSIS — Z8589 Personal history of malignant neoplasm of other organs and systems: Secondary | ICD-10-CM

## 2023-10-25 DIAGNOSIS — I10 Essential (primary) hypertension: Secondary | ICD-10-CM

## 2023-10-25 DIAGNOSIS — E1169 Type 2 diabetes mellitus with other specified complication: Secondary | ICD-10-CM | POA: Diagnosis not present

## 2023-10-25 DIAGNOSIS — K219 Gastro-esophageal reflux disease without esophagitis: Secondary | ICD-10-CM

## 2023-10-25 DIAGNOSIS — E559 Vitamin D deficiency, unspecified: Secondary | ICD-10-CM

## 2023-10-25 DIAGNOSIS — E538 Deficiency of other specified B group vitamins: Secondary | ICD-10-CM

## 2023-10-25 MED ORDER — TRIAMTERENE-HCTZ 37.5-25 MG PO CAPS: 1.0000 | ORAL_CAPSULE | Freq: Every day | ORAL | 3 refills | Status: AC

## 2023-10-25 MED ORDER — PANTOPRAZOLE SODIUM 20 MG PO TBEC: 20.0000 mg | DELAYED_RELEASE_TABLET | Freq: Every day | ORAL | 3 refills | Status: DC

## 2023-10-25 MED ORDER — COLESTIPOL HCL 1 G PO TABS: 1.0000 g | ORAL_TABLET | Freq: Two times a day (BID) | ORAL | 3 refills | Status: AC

## 2023-10-25 MED ORDER — AMLODIPINE BESYLATE 2.5 MG PO TABS: 2.5000 mg | ORAL_TABLET | Freq: Every day | ORAL | 3 refills | Status: AC

## 2023-10-25 NOTE — Progress Notes (Signed)
 SUBJECTIVE:   Chief Complaint  Patient presents with   Medical Management of Chronic Issues    3 month follow up   HPI Presents for follow up chronic disease management  Discussed the use of AI scribe software for clinical note transcription with the patient, who gave verbal consent to proceed.  History of Present Illness Breanna Farrell is a 68 year old female who presents for a follow-up visit.  Her blood pressure is well-controlled. She recently underwent knee surgery and is relieved to have completed it. She has not had blood work done since October and is due for lab work, including an A1c test in April.  She has a history of elevated triglycerides, which have been fluctuating. She attributes this to her autoimmune conditions and medication use, including prednisone. Her son also had high triglycerides. She has experienced joint pain with Lipitor and is considering alternative treatments for her cholesterol.  She is a controlled diabetic with a previous A1c of 6.2. She does not currently require medication for diabetes.  She has been on various medications, including prednisone for flares in her hip and tendons, and Rinvoq for her autoimmune condition. She has also used Lovenox injections post-surgery to prevent blood clots. She is currently taking Celebrex, Zyrtec, vitamin D, Flonase, and triamterene.  She has a history of squamous cell carcinoma and is concerned about a new lesion on her nose. She previously had a dermatologist in [facility] and is trying to establish care locally.  She has osteoporosis, particularly in the left hip, and has been on Evenity in the past. She is taking calcium supplements but has not been on osteoporosis medication since moving.      PERTINENT PMH / PSH: As above  OBJECTIVE:  BP 118/78   Pulse 92   Temp 98.2 F (36.8 C)   Resp 20   Ht 5\' 4"  (1.626 m)   Wt 194 lb 4 oz (88.1 kg)   SpO2 97%   BMI 33.34 kg/m    Physical Exam Vitals  reviewed.  Constitutional:      General: She is not in acute distress.    Appearance: Normal appearance. She is obese. She is not ill-appearing, toxic-appearing or diaphoretic.  Eyes:     General:        Right eye: No discharge.        Left eye: No discharge.     Conjunctiva/sclera: Conjunctivae normal.  Cardiovascular:     Rate and Rhythm: Normal rate and regular rhythm.     Heart sounds: Normal heart sounds.  Pulmonary:     Effort: Pulmonary effort is normal.     Breath sounds: Normal breath sounds.  Abdominal:     General: Bowel sounds are normal.  Musculoskeletal:        General: Normal range of motion.  Skin:    General: Skin is warm and dry.  Neurological:     General: No focal deficit present.     Mental Status: She is alert and oriented to person, place, and time. Mental status is at baseline.  Psychiatric:        Mood and Affect: Mood normal.        Behavior: Behavior normal.        Thought Content: Thought content normal.        Judgment: Judgment normal.           10/25/2023    1:08 PM 05/14/2023    9:17 AM 03/26/2018  3:45 PM  Depression screen PHQ 2/9  Decreased Interest 1 0 0  Down, Depressed, Hopeless 1 0 0  PHQ - 2 Score 2 0 0  Altered sleeping 1 0   Tired, decreased energy 1 0   Change in appetite 1 0   Feeling bad or failure about yourself  0 0   Trouble concentrating 0 0   Moving slowly or fidgety/restless 0 0   Suicidal thoughts 0 0   PHQ-9 Score 5 0   Difficult doing work/chores  Not difficult at all       05/14/2023    9:17 AM  GAD 7 : Generalized Anxiety Score  Nervous, Anxious, on Edge 0  Control/stop worrying 0  Worry too much - different things 0  Trouble relaxing 0  Restless 0  Easily annoyed or irritable 0  Afraid - awful might happen 0  Total GAD 7 Score 0  Anxiety Difficulty Not difficult at all    ASSESSMENT/PLAN:  Hypertension associated with diabetes (HCC) Assessment & Plan: Well-controlled hypertension on  amlodipine and triamterene. - Refill of amlodipine and triamterene. -Check Cmet  Orders: -     Comprehensive metabolic panel with GFR; Future -     Triamterene-HCTZ; Take 1 each (1 capsule total) by mouth daily.  Dispense: 90 capsule; Refill: 3 -     amLODIPine Besylate; Take 1 tablet (2.5 mg total) by mouth daily.  Dispense: 90 tablet; Refill: 3  Hyperlipidemia associated with type 2 diabetes mellitus (HCC) Assessment & Plan: Chronic hyperlipidemia with elevated triglycerides, likely exacerbated by medications and autoimmune conditions. Previous statin intolerance due to joint pain. Discussed Repatha, a PCSK9 inhibitor, as an alternative. - Order fasting lipid panel. - Provide information on Repatha. - Consult pharmacist regarding Repatha coverage and assistance. - Consider starting Repatha after lab results.  Orders: -     Lipid panel; Future -     Colestipol HCl; Take 1 tablet (1 g total) by mouth 2 (two) times daily.  Dispense: 180 tablet; Refill: 3  Type 2 diabetes mellitus with complications (HCC) Assessment & Plan: Controlled diabetes with A1c at 6.2. Discussed potential use of Ozempic if A1c increases. - A1c in April. - Consider starting Ozempic if A1c increases to 6.5 or higher.    Orders: -     Hemoglobin A1c; Future -     Microalbumin / creatinine urine ratio; Future  Osteoporosis, unspecified osteoporosis type, unspecified pathological fracture presence Assessment & Plan: Osteoporosis in left hip, previously treated with Evenity. No current medication due to relocation. - Patient to call and schedule DEXA scan to assess current bone density.   Rheumatoid arthritis, involving unspecified site, unspecified whether rheumatoid factor present [facility]) Assessment & Plan: Autoimmune conditions managed with prednisone and Rinvoq. Recent flare-ups managed with prednisone. Coordination with rheumatologist needed. - Ensure referral to rheumatologist is completed. - Follow up  with rheumatologist regarding ongoing management.   Systemic lupus erythematosus, unspecified SLE type, unspecified organ involvement status (HCC)  Vitamin B 12 deficiency -     Vitamin B12; Future  Vitamin D deficiency -     VITAMIN D 25 Hydroxy (Vit-D Deficiency, Fractures); Future  Gastroesophageal reflux disease, unspecified whether esophagitis present -     Pantoprazole Sodium; Take 1 tablet (20 mg total) by mouth daily.  Dispense: 90 tablet; Refill: 3  History of squamous cell carcinoma Assessment & Plan: Squamous cell carcinoma on nose requires dermatological evaluation. Discussed referral options. - Advise her to contact dermatology for appointment. - Offer  to send referral if needed.  Orders: -     Ambulatory referral to Dermatology    PDMP reviewed  Return in about 4 weeks (around 11/22/2023) for PCP.  Dana Allan, MD

## 2023-10-25 NOTE — Patient Instructions (Addendum)
 It was a pleasure meeting you today. Thank you for allowing me to take part in your health care.  Our goals for today as we discussed include:  Schedule fasting lab appointment 04/07  Refills sent for requested medication  Call [facility] [place] to schedule appointment.  If referral needed please let me know  Follow up with referral to Rheumatology with Dr Allena Katz.  Foot exam at next visit   This is a list of the screening recommended for you and due dates:  Health Maintenance  Topic Date Due   Medicare Annual Wellness Visit  Never done   Complete foot exam   Never done   Eye exam for diabetics  Never done   Zoster (Shingles) Vaccine (1 of 2) Never done   DEXA scan (bone density measurement)  Never done   Mammogram  10/10/2019   Pneumonia Vaccine (3 of 3 - PPSV23 or PCV20) 11/24/2020   DTaP/Tdap/Td vaccine (3 - Tdap) 09/05/2022   COVID-19 Vaccine (5 - 2024-25 season) 04/08/2023   Flu Shot  11/05/2023*   Hemoglobin A1C  11/12/2023   Yearly kidney function blood test for diabetes  05/13/2024   Yearly kidney health urinalysis for diabetes  05/13/2024   Colon Cancer Screening  05/30/2026   Hepatitis C Screening  Completed   HPV Vaccine  Aged Out  *Topic was postponed. The date shown is not the original due date.      If you have any questions or concerns, please do not hesitate to call the office at [phone].  I look forward to our next visit and until then take care and stay safe.  Regards,   Dana Allan, MD   [facility]

## 2023-11-03 ENCOUNTER — Encounter: Payer: Self-pay | Admitting: Family Medicine

## 2023-11-03 DIAGNOSIS — Z8589 Personal history of malignant neoplasm of other organs and systems: Secondary | ICD-10-CM | POA: Insufficient documentation

## 2023-11-03 NOTE — Assessment & Plan Note (Signed)
 Well-controlled hypertension on amlodipine and triamterene. - Refill of amlodipine and triamterene. -Check Cmet

## 2023-11-03 NOTE — Assessment & Plan Note (Signed)
 Autoimmune conditions managed with prednisone and Rinvoq. Recent flare-ups managed with prednisone. Coordination with rheumatologist needed. - Ensure referral to rheumatologist is completed. - Follow up with rheumatologist regarding ongoing management.

## 2023-11-03 NOTE — Assessment & Plan Note (Signed)
 Chronic hyperlipidemia with elevated triglycerides, likely exacerbated by medications and autoimmune conditions. Previous statin intolerance due to joint pain. Discussed Repatha, a PCSK9 inhibitor, as an alternative. - Order fasting lipid panel. - Provide information on Repatha. - Consult pharmacist regarding Repatha coverage and assistance. - Consider starting Repatha after lab results.

## 2023-11-03 NOTE — Assessment & Plan Note (Signed)
 Osteoporosis in left hip, previously treated with Evenity. No current medication due to relocation. - Patient to call and schedule DEXA scan to assess current bone density.

## 2023-11-03 NOTE — Assessment & Plan Note (Signed)
 Squamous cell carcinoma on nose requires dermatological evaluation. Discussed referral options. - Advise her to contact dermatology for appointment. - Offer to send referral if needed.

## 2023-11-03 NOTE — Assessment & Plan Note (Signed)
 Controlled diabetes with A1c at 6.2. Discussed potential use of Ozempic if A1c increases. - A1c in April. - Consider starting Ozempic if A1c increases to 6.5 or higher.

## 2023-11-12 ENCOUNTER — Other Ambulatory Visit (INDEPENDENT_AMBULATORY_CARE_PROVIDER_SITE_OTHER)

## 2023-11-12 DIAGNOSIS — E559 Vitamin D deficiency, unspecified: Secondary | ICD-10-CM

## 2023-11-12 DIAGNOSIS — E538 Deficiency of other specified B group vitamins: Secondary | ICD-10-CM | POA: Diagnosis not present

## 2023-11-12 DIAGNOSIS — E118 Type 2 diabetes mellitus with unspecified complications: Secondary | ICD-10-CM

## 2023-11-12 DIAGNOSIS — E1159 Type 2 diabetes mellitus with other circulatory complications: Secondary | ICD-10-CM

## 2023-11-12 DIAGNOSIS — E785 Hyperlipidemia, unspecified: Secondary | ICD-10-CM

## 2023-11-12 DIAGNOSIS — I152 Hypertension secondary to endocrine disorders: Secondary | ICD-10-CM

## 2023-11-12 DIAGNOSIS — E1169 Type 2 diabetes mellitus with other specified complication: Secondary | ICD-10-CM

## 2023-11-12 LAB — COMPREHENSIVE METABOLIC PANEL WITH GFR
ALT: 17 U/L (ref 0–35)
AST: 21 U/L (ref 0–37)
Albumin: 4.7 g/dL (ref 3.5–5.2)
Alkaline Phosphatase: 75 U/L (ref 39–117)
BUN: 16 mg/dL (ref 6–23)
CO2: 28 meq/L (ref 19–32)
Calcium: 9.8 mg/dL (ref 8.4–10.5)
Chloride: 100 meq/L (ref 96–112)
Creatinine, Ser: 0.89 mg/dL (ref 0.40–1.20)
GFR: 65.83 mL/min (ref >60.00)
Glucose, Bld: 97 mg/dL (ref 70–99)
Potassium: 3.8 meq/L (ref 3.5–5.1)
Sodium: 137 meq/L (ref 135–145)
Total Bilirubin: 0.8 mg/dL (ref 0.2–1.2)
Total Protein: 7.6 g/dL (ref 6.0–8.3)

## 2023-11-12 LAB — LIPID PANEL
Cholesterol: 308 mg/dL — ABNORMAL HIGH (ref 0–200)
HDL: 33.1 mg/dL — ABNORMAL LOW (ref >39.00)
NonHDL: 275.37
Total CHOL/HDL Ratio: 9
Triglycerides: 976 mg/dL — ABNORMAL HIGH (ref 0.0–149.0)
VLDL: 195.2 mg/dL — ABNORMAL HIGH (ref 0.0–40.0)

## 2023-11-12 LAB — VITAMIN B12: Vitamin B-12: 238 pg/mL (ref 211–911)

## 2023-11-12 LAB — LDL CHOLESTEROL, DIRECT: Direct LDL: 54 mg/dL

## 2023-11-12 LAB — HEMOGLOBIN A1C: Hgb A1c MFr Bld: 6.7 % — ABNORMAL HIGH (ref 4.6–6.5)

## 2023-11-12 LAB — VITAMIN D 25 HYDROXY (VIT D DEFICIENCY, FRACTURES): VITD: 34.75 ng/mL (ref 30.00–100.00)

## 2023-11-12 NOTE — Addendum Note (Signed)
 Addended by: Warden Fillers on: 11/12/2023 10:54 AM   Modules accepted: Orders

## 2023-11-13 ENCOUNTER — Other Ambulatory Visit: Payer: Self-pay | Admitting: Family Medicine

## 2023-11-13 ENCOUNTER — Telehealth: Payer: Self-pay

## 2023-11-13 DIAGNOSIS — E118 Type 2 diabetes mellitus with unspecified complications: Secondary | ICD-10-CM

## 2023-11-13 MED ORDER — METFORMIN HCL ER 500 MG PO TB24: 500.0000 mg | ORAL_TABLET | Freq: Every day | ORAL | 3 refills | Status: DC

## 2023-11-13 NOTE — Telephone Encounter (Signed)
 Patient notified of recent lab results and documentation completed in results management tab in patient's chart.

## 2023-11-13 NOTE — Telephone Encounter (Signed)
 Copied from CRM [id number]. Topic: General - Other >> Nov 13, 2023 10:50 AM Cammy Copa D wrote: Reason for CRM: Pt returning call, please call back when available

## 2023-11-13 NOTE — Assessment & Plan Note (Signed)
 Start Metformin XR 500 mg daily

## 2023-11-15 NOTE — Telephone Encounter (Signed)
 Breanna Farrell spoke to pt about labs results.

## 2023-11-15 NOTE — Telephone Encounter (Signed)
 Copied from CRM [id number]. Topic: General - Call Back - No Documentation >> Nov 15, 2023  2:02 PM Isabelle Course C wrote: Reason for CRM: Patient missed call from [place]. Did not see any notes from today. Please give patient a call regarding this matter.

## 2023-11-21 ENCOUNTER — Ambulatory Visit (INDEPENDENT_AMBULATORY_CARE_PROVIDER_SITE_OTHER): Admitting: Family Medicine

## 2023-11-21 ENCOUNTER — Encounter: Payer: Self-pay | Admitting: Family Medicine

## 2023-11-21 VITALS — BP 126/76 | HR 86 | Temp 97.7°F | Resp 20 | Ht 64.0 in | Wt 193.4 lb

## 2023-11-21 DIAGNOSIS — Z780 Asymptomatic menopausal state: Secondary | ICD-10-CM

## 2023-11-21 DIAGNOSIS — E1169 Type 2 diabetes mellitus with other specified complication: Secondary | ICD-10-CM

## 2023-11-21 DIAGNOSIS — E785 Hyperlipidemia, unspecified: Secondary | ICD-10-CM

## 2023-11-21 DIAGNOSIS — E1159 Type 2 diabetes mellitus with other circulatory complications: Secondary | ICD-10-CM | POA: Diagnosis not present

## 2023-11-21 DIAGNOSIS — I152 Hypertension secondary to endocrine disorders: Secondary | ICD-10-CM

## 2023-11-21 DIAGNOSIS — M5417 Radiculopathy, lumbosacral region: Secondary | ICD-10-CM | POA: Insufficient documentation

## 2023-11-21 DIAGNOSIS — E118 Type 2 diabetes mellitus with unspecified complications: Secondary | ICD-10-CM | POA: Diagnosis not present

## 2023-11-21 DIAGNOSIS — J069 Acute upper respiratory infection, unspecified: Secondary | ICD-10-CM

## 2023-11-21 MED ORDER — EZETIMIBE 10 MG PO TABS: 10.0000 mg | ORAL_TABLET | Freq: Every day | ORAL | 3 refills | Status: AC

## 2023-11-21 NOTE — Progress Notes (Signed)
 SUBJECTIVE:   Chief Complaint  Patient presents with   Medical Management of Chronic Issues    4 week follow up   HPI Presents for chronic disease management  Discussed the use of AI scribe software for clinical note transcription with the patient, who gave verbal consent to proceed.  History of Present Illness A 68 year old female with rheumatoid arthritis and diabetes presents for a follow-up visit.  She developed symptoms of a cold after her husband contracted a severe cold, including a tickling sensation in her throat, a mild cough, and burning eyes. Symptoms started 1 days ago.  No fever, wheezing, or shortness of breath. She has been using DayQuil for symptom relief and prefers not to start any new medications due to potential interactions with her rheumatoid arthritis medications.  She was started on metformin  for an A1c of 6.7 but discontinued it due to severe side effects, including watery diarrhea, dizziness, and nausea. 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 She has experienced increased diarrhea since her surgery and has difficulty tolerating many medications.  She has a history of rheumatoid arthritis diagnosed in 2013 and has been on steroids frequently since then. She mentions that her medications have not been as effective since her surgery. She is scheduled to see Dr. Lydia Sams on May 1st for further management.  She has a history of psoriasis and recently underwent cryotherapy for skin peeling. She manages her psoriasis with a dermatologist and plans to continue with them for full-body checks.  She reports elevated triglycerides and cholesterol levels, which have been fluctuating. She has been using colestipol  but was not consistent with it post-surgery. She has not tolerated statins well in the past and is not currently on any cholesterol-lowering medications. She is taking fish oil and calcium  with  vitamin D  supplements.  She is currently on amlodipine  2.5 mg for blood pressure, which she reports is well-controlled. She also takes Protonix , which was recently refilled at a lower dose than usual.    PERTINENT PMH / PSH: As above  OBJECTIVE:  BP 126/76   Pulse 86   Temp 97.7 F (36.5 C)   Resp 20   Ht 5\' 4"  (1.626 m)   Wt 193 lb 6 oz (87.7 kg)   SpO2 98%   BMI 33.19 kg/m    Physical Exam Vitals reviewed.  Constitutional:      General: She is not in acute distress.    Appearance: Normal appearance. She is obese. She is not ill-appearing, toxic-appearing or diaphoretic.  HENT:     Nose: Rhinorrhea present.  Eyes:     General:        Right eye: No discharge.        Left eye: No discharge.     Conjunctiva/sclera: Conjunctivae normal.  Cardiovascular:     Rate and Rhythm: Normal rate and regular rhythm.     Heart sounds: Normal heart sounds.  Pulmonary:     Effort: Pulmonary effort is normal.     Breath sounds: Normal breath sounds. No wheezing or rhonchi.  Abdominal:     General: Bowel sounds are normal.  Musculoskeletal:        General: Normal range of motion.  Skin:    General: Skin is warm and dry.  Neurological:     General: No focal deficit present.     Mental Status: She is alert and oriented to person, place, and time. Mental status is at  baseline.  Psychiatric:        Mood and Affect: Mood normal.        Behavior: Behavior normal.        Thought Content: Thought content normal.        Judgment: Judgment normal.           11/21/2023    2:15 PM 10/25/2023    1:08 PM 05/14/2023    9:17 AM 03/26/2018    3:45 PM  Depression screen PHQ 2/9  Decreased Interest 1 1 0 0  Down, Depressed, Hopeless 1 1 0 0  PHQ - 2 Score 2 2 0 0  Altered sleeping 1 1 0   Tired, decreased energy 1 1 0   Change in appetite 0 1 0   Feeling bad or failure about yourself  0 0 0   Trouble concentrating 0 0 0   Moving slowly or fidgety/restless 0 0 0   Suicidal thoughts 0 0  0   PHQ-9 Score 4 5 0   Difficult doing work/chores Not difficult at all  Not difficult at all       11/21/2023    2:15 PM 05/14/2023    9:17 AM  GAD 7 : Generalized Anxiety Score  Nervous, Anxious, on Edge 1 0  Control/stop worrying 1 0  Worry too much - different things 1 0  Trouble relaxing 1 0  Restless 0 0  Easily annoyed or irritable 1 0  Afraid - awful might happen 1 0  Total GAD 7 Score 6 0  Anxiety Difficulty  Not difficult at all    ASSESSMENT/PLAN:  Type 2 diabetes mellitus with complications (HCC) Assessment & Plan: Discontinued metformin  due to side effects. Discussed alternative medications and cost concerns. Emphasized maintaining A1c below 7%. - Consider GLP1 in future - Patient prefers  to manage with diet and exercise for three months; re-evaluate A1c.   Hypertension associated with diabetes (HCC) Assessment & Plan: Well-controlled  - Continue amlodipine  and triamterene .    Postmenopausal estrogen deficiency -     DG Bone Density; Future  Hyperlipidemia associated with type 2 diabetes mellitus (HCC) Assessment & Plan: Elevated triglycerides and cholesterol. Unable to tolerate statins. Discussed alternative treatments. - Start Zetia  10 mg daily. - Consider adding fenofibrate if needed. - Continue colestipol   - Consider Repatha in future  Orders: -     Ezetimibe ; Take 1 tablet (10 mg total) by mouth daily.  Dispense: 90 tablet; Refill: 3  Upper respiratory tract infection, unspecified type Assessment & Plan: Symptoms suggest viral etiology. No antibiotics or steroids needed. - Advise DayQuil, honey, teas for symptom relief. - Monitor for fever, shortness of breath, wheezing; re-evaluate if symptoms worsen.      PDMP reviewed  Return if symptoms worsen or fail to improve, for PCP.  Valli Gaw, MD

## 2023-11-21 NOTE — Patient Instructions (Addendum)
 It was a pleasure meeting you today. Thank you for allowing me to take part in your health care.  Our goals for today as we discussed include:  Stop Metformin Consider alternative, Ozempic injectable, Rybelsus daily pill, Jardiance daily pill  Triglycerides increased Start Zetia 10 mg daily Continue Colestid as ordered Consider Repatha injections  Follow up with Rheumatology as scheduled    This is a list of the screening recommended for you and due dates:  Health Maintenance  Topic Date Due   Medicare Annual Wellness Visit  Never done   Complete foot exam   Never done   Eye exam for diabetics  Never done   Zoster (Shingles) Vaccine (1 of 2) Never done   DEXA scan (bone density measurement)  Never done   Mammogram  10/10/2018   DTaP/Tdap/Td vaccine (3 - Tdap) 09/05/2022   COVID-19 Vaccine (5 - 2024-25 season) 04/08/2023   Flu Shot  03/07/2024   Yearly kidney health urinalysis for diabetes  05/13/2024   Hemoglobin A1C  05/13/2024   Yearly kidney function blood test for diabetes  11/11/2024   Pneumonia Vaccine (3 of 3 - PCV20 or PCV21) 11/24/2024   Colon Cancer Screening  05/30/2026   Hepatitis C Screening  Completed   HPV Vaccine  Aged Out   Meningitis B Vaccine  Aged Out      If you have any questions or concerns, please do not hesitate to call the office at [phone].  I look forward to our next visit and until then take care and stay safe.  Regards,   Valli Gaw, MD   [facility]

## 2023-11-22 ENCOUNTER — Telehealth: Payer: Self-pay

## 2023-11-22 DIAGNOSIS — R0981 Nasal congestion: Secondary | ICD-10-CM

## 2023-11-22 DIAGNOSIS — J069 Acute upper respiratory infection, unspecified: Secondary | ICD-10-CM

## 2023-11-22 NOTE — Telephone Encounter (Signed)
 Copied from CRM [id number]. Topic: General - Other >> Nov 22, 2023 10:58 AM Bambi Bonine D wrote: Reason for CRM: Patient stated that she feels like she is coming down with something and would like to speak with Dr.Walsh's Nurse. Patient stated that she spoke the nurse yesterday at her appointment and was told to reach out if she starts to feel worse.

## 2023-11-22 NOTE — Telephone Encounter (Signed)
 Cough in throat but not a sore throat, last night chills, and congestion. She did said she has a headache from being so stopped up. I did advise pt to go to ED if SOB, chest pain, or maintain hydration. She did say with her having the autoimmune disorder that she does not usually get better without an antibiotic.

## 2023-11-23 ENCOUNTER — Ambulatory Visit
Admission: RE | Admit: 2023-11-23 | Discharge: 2023-11-23 | Disposition: A | Discharge status: Home / Self Care | Source: Ambulatory Visit | Attending: Emergency Medicine

## 2023-11-23 VITALS — BP 133/81 | HR 86 | Temp 97.8°F | Resp 20

## 2023-11-23 DIAGNOSIS — J302 Other seasonal allergic rhinitis: Secondary | ICD-10-CM

## 2023-11-23 DIAGNOSIS — R051 Acute cough: Secondary | ICD-10-CM

## 2023-11-23 LAB — POC SARS CORONAVIRUS 2 AG -  ED: SARS Coronavirus 2 Ag: NEGATIVE

## 2023-11-23 MED ORDER — BENZONATATE 100 MG PO CAPS: 100.0000 mg | ORAL_CAPSULE | Freq: Three times a day (TID) | ORAL | 0 refills | Status: DC | PRN

## 2023-11-23 NOTE — Discharge Instructions (Addendum)
 The COVID test is negative.    Take the Tessalon  Perles as directed for cough.    Continue allergy medication as directed.    Follow-up with your primary care provider if your symptoms are not improving.

## 2023-11-23 NOTE — ED Triage Notes (Addendum)
 Patient to Urgent Care with complaints of congested cough/ rib soreness/ watery eyes/ scratchy throat/ sinus pain and pressure. Denies any known fevers- has felt cold.   Symptoms started Wednesday. Hx of RA.   Meds: Dayquil/ salt water/ zyrtec/ nasal spray/ flonase 

## 2023-11-23 NOTE — ED Provider Notes (Signed)
 Breanna Farrell    CSN: [id number] Arrival date & time: 11/23/23  1301      History   Chief Complaint Chief Complaint  Patient presents with   Cough    Entered by patient    HPI Breanna Farrell is a 68 y.o. female.  Patient presents with 2-day history of congestion and cough.  She reports itchy watery eyes and scratchy throat.  No fever or shortness of breath.  Treating with OTC cold and allergy medication.  Patient was seen here on 10/08/2023; diagnosed with acute sinusitis; treated with Augmentin .  The history is provided by the patient and medical records.    Past Medical History:  Diagnosis Date   Abnormal cervical Pap smear with positive HPV DNA test 02/01/2012   Allergy    Tramadol   Anxiety    Arthritis 2014   rheumatoid   Articular cartilage disorder    bulging & herniated disc in lower back   Benign essential hypertension    Breast tenderness    Bronchitis 06/05/2017   Bulging discs 2012   Colitis 2007   ulcerative   Depression    Esophageal reflux    Family history of adverse reaction to anesthesia    MOM-N/V   Hernia 2007   hiatal   History of hiatal hernia    SMALL PER PT   Hypertension 2007   Pap smear abnormality of cervix with LGSIL 07/18/2012   BVD   Psoriatic arthritis (HCC)    Rheumatoid arthritis (HCC)     Patient Active Problem List   Diagnosis Date Noted   Lumbosacral radiculitis 11/21/2023   History of squamous cell carcinoma 11/03/2023   Pain in left knee 07/13/2023   Unilateral primary osteoarthritis, left knee 06/13/2023   SLE (systemic lupus erythematosus) (HCC) 05/19/2023   RA (rheumatoid arthritis) (HCC) 05/19/2023   Hypertension associated with diabetes (HCC) 05/19/2023   Vitamin D  deficiency 05/19/2023   Vitamin B 12 deficiency 05/19/2023   Need for hepatitis C screening test 05/19/2023   Breast cancer screening by mammogram 05/19/2023   Hypertriglyceridemia 05/19/2023   Osteoarthritis 05/19/2023   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   Presence of right artificial hip joint 03/29/2022   Unilateral primary osteoarthritis, right hip 08/29/2021   Lumbar spondylosis 03/17/2021   Lumbar radiculopathy 03/08/2021   Trochanteric bursitis of right hip 03/08/2021   Psoriasis 01/13/2021   Steatosis of liver 01/13/2021   Colitis 08/19/2018   Abdominal pain, chronic, epigastric    Stomach irritation    Intractable vomiting with nausea    Special screening for malignant neoplasms, colon    External hemorrhoids    Angiodysplasia of intestinal tract    Benign neoplasm of descending colon    Benign neoplasm of cecum    Rectal polyp    Polyp of sigmoid colon    Epigastric discomfort 03/26/2018   Osteoporosis 02/27/2018   Leukocytosis 09/14/2017   Elevated LFTs 09/14/2017   Carpal tunnel syndrome 07/18/2017   Lower extremity edema 01/26/2017   Anxiety and depression 01/26/2017   GERD (gastroesophageal reflux disease) 11/07/2016   Psoriatic arthritis (HCC) 03/16/2015   History of osteoporosis 01/06/2015   Hyperlipidemia associated with type 2 diabetes mellitus (HCC) 01/06/2015   Essential hypertension 01/06/2015   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/06/2015    Past Surgical History:  Procedure Laterality Date   ABDOMINAL HYSTERECTOMY  1986   [facility]   BACK SURGERY  12/27/2012   [facility]  BREAST BIOPSY Left 11/15/2017   x shape marker path pending UOQ   BREAST BIOPSY Left 11/15/2017   coil shape marker path pending LOQ   CARPAL TUNNEL RELEASE Right 07/10/2017   Procedure: CARPAL TUNNEL RELEASE;  Surgeon: Rande Bushy, MD;  Location: [facility];  Service: Orthopedics;  Laterality: Right;   CESAREAN SECTION  1981   CESAREAN SECTION  1981   [facility]   CHOLECYSTECTOMY  2007   [facility]   COLONOSCOPY  2010   COLONOSCOPY WITH PROPOFOL  N/A 04/11/2018   Procedure: COLONOSCOPY WITH PROPOFOL ;  Surgeon: Irby Mannan, MD;  Location: [facility];   Service: Endoscopy;  Laterality: N/A;   COLPOSCOPY     ESOPHAGOGASTRODUODENOSCOPY (EGD) WITH PROPOFOL  N/A 04/11/2018   Procedure: ESOPHAGOGASTRODUODENOSCOPY (EGD) WITH PROPOFOL ;  Surgeon: Irby Mannan, MD;  Location: [facility];  Service: Endoscopy;  Laterality: N/A;   JOINT REPLACEMENT  2023   Right hip replacement   POLYPECTOMY  2007   one   SPINE SURGERY  2022   Multi level fusion, laminectomy   UPPER GI ENDOSCOPY  2010    OB History   No obstetric history on file.      Home Medications    Prior to Admission medications   Medication Sig Start Date End Date Taking? Authorizing Provider  benzonatate  (TESSALON ) 100 MG capsule Take 1 capsule (100 mg total) by mouth 3 (three) times daily as needed for cough. 11/23/23  Yes Wellington Half, NP  amLODipine  (NORVASC ) 2.5 MG tablet Take 1 tablet (2.5 mg total) by mouth daily. 10/25/23   Valli Gaw, MD  azelastine  (ASTELIN ) 0.1 % nasal spray Place 2 sprays into both nostrils 2 (two) times daily. Use in each nostril as directed 09/30/23   Marciana Settle, PA-C  Calcium  Carbonate-Vit D-Min (CALCIUM  1200 PO) Take 1,200 mg by mouth daily.    [provider]  celecoxib (CELEBREX) 100 MG capsule Take 100 mg by mouth as needed. 01/17/23   [provider]  cetirizine (ZYRTEC) 10 MG tablet Take 10 mg by mouth daily.    [provider]  Cholecalciferol (VITAMIN D -3) 1000 units CAPS Take 2,000 Units by mouth daily.     [provider]  colestipol  (COLESTID ) 1 g tablet Take 1 tablet (1 g total) by mouth 2 (two) times daily. 10/25/23   Valli Gaw, MD  ezetimibe  (ZETIA ) 10 MG tablet Take 1 tablet (10 mg total) by mouth daily. 11/21/23   Valli Gaw, MD  fluticasone  (FLONASE ) 50 MCG/ACT nasal spray Place 2 sprays into both nostrils daily. 09/30/23   Marciana Settle, PA-C  folic acid (FOLVITE) 800 MCG tablet Take 800 mcg by mouth daily.    [provider]  ketoconazole (NIZORAL) 2 % cream Apply 1  Application topically as needed.    [provider]  montelukast (SINGULAIR) 10 MG tablet Take 10 mg by mouth at bedtime. 01/26/20   [provider]  Omega-3 Fatty Acids (SM FISH OIL) 1000 MG CAPS Take 1,000 mg by mouth daily.     [provider]  pantoprazole  (PROTONIX ) 40 MG tablet Take 40 mg by mouth daily. 09/02/23   [provider]  triamterene -hydrochlorothiazide (DYAZIDE) 37.5-25 MG capsule Take 1 each (1 capsule total) by mouth daily. 10/25/23   Valli Gaw, MD  Upadacitinib ER (RINVOQ) 15 MG TB24 Take 15 mg by mouth daily. 09/14/22   [provider]  vitamin E 1000 UNIT capsule Take 1,000 Units by mouth daily. 01/26/20   [provider]  Family History Family History  Problem Relation Age of Onset   Melanoma Mother    Hypertension Mother    Arthritis Mother    Kidney disease Mother    Ovarian cancer Maternal Aunt    Lung cancer Maternal Grandmother        Great Grandmother   Ovarian cancer Maternal Grandmother        Great Grandmother   Heart disease Maternal Grandfather    Leukemia Paternal Grandmother    Breast cancer Neg Hx     Social History Social History   Tobacco Use   Smoking status: Former    Current packs/day: 0.00    Average packs/day: 0.7 packs/day for 20.0 years (14.0 ttl pk-yrs)    Types: Cigarettes    Start date: 10/24/1988    Quit date: 10/24/2008    Years since quitting: 15.0   Smokeless tobacco: Never  Vaping Use   Vaping status: Never Used  Substance Use Topics   Alcohol use: Not Currently    Comment: SOCIALLY   Drug use: No     Allergies   Hepatitis b vaccine, Metoclopramide, Hydroxychloroquine, Lyrica [pregabalin], Mycophenolate mofetil, and Tramadol   Review of Systems Review of Systems  Constitutional:  Negative for chills and fever.  HENT:  Positive for congestion, rhinorrhea and sore throat. Negative for ear discharge.   Eyes:  Positive for itching. Negative for pain, redness and  visual disturbance.  Respiratory:  Positive for cough. Negative for shortness of breath.      Physical Exam Triage Vital Signs ED Triage Vitals  Encounter Vitals Group     BP      Systolic BP Percentile      Diastolic BP Percentile      Pulse      Resp      Temp      Temp src      SpO2      Weight      Height      Head Circumference      Peak Flow      Pain Score      Pain Loc      Pain Education      Exclude from Growth Chart    No data found.  Updated Vital Signs BP 133/81   Pulse 86   Temp 97.8 F (36.6 C)   Resp 20   SpO2 96%   Visual Acuity Right Eye Distance:   Left Eye Distance:   Bilateral Distance:    Right Eye Near:   Left Eye Near:    Bilateral Near:     Physical Exam Constitutional:      General: She is not in acute distress. HENT:     Right Ear: Tympanic membrane normal.     Left Ear: Tympanic membrane normal.     Nose: Rhinorrhea present.     Mouth/Throat:     Mouth: Mucous membranes are moist.     Pharynx: Oropharynx is clear.     Comments: Clear PND Eyes:     General:        Right eye: No discharge.        Left eye: No discharge.     Conjunctiva/sclera: Conjunctivae normal.     Pupils: Pupils are equal, round, and reactive to light.  Cardiovascular:     Rate and Rhythm: Normal rate and regular rhythm.     Heart sounds: Normal heart sounds.  Pulmonary:     Effort: Pulmonary effort is normal. No  respiratory distress.     Breath sounds: Normal breath sounds.  Neurological:     Mental Status: She is alert.      UC Treatments / Results  Labs (all labs ordered are listed, but only abnormal results are displayed) Labs Reviewed  POC SARS CORONAVIRUS 2 AG -  ED    EKG   Radiology No results found.  Procedures Procedures (including critical care time)  Medications Ordered in UC Medications - No data to display  Initial Impression / Assessment and Plan / UC Course  I have reviewed the triage vital signs and the nursing  notes.  Pertinent labs & imaging results that were available during my care of the patient were reviewed by me and considered in my medical decision making (see chart for details).    Cough, seasonal allergies.  Afebrile and vital signs are stable.  Lungs are clear and O2 sat is 96% on room air.  COVID-negative.  Patient has been symptomatic for 2 days.  Treating today with Tessalon  Perles as needed for cough.  Instructed her to continue allergy medication.  Instructed her to follow-up with her PCP on Monday if her symptoms are not improving.  Education provided on cough and allergic rhinitis.  She agrees to plan of care.  Final Clinical Impressions(s) / UC Diagnoses   Final diagnoses:  Acute cough  Seasonal allergies     Discharge Instructions      The COVID test is negative.    Take the Tessalon  Perles as directed for cough.    Continue allergy medication as directed.    Follow-up with your primary care provider if your symptoms are not improving.      ED Prescriptions     Medication Sig Dispense Auth. Provider   benzonatate  (TESSALON ) 100 MG capsule Take 1 capsule (100 mg total) by mouth 3 (three) times daily as needed for cough. 21 capsule Wellington Half, NP      PDMP not reviewed this encounter.   Wellington Half, NP 11/23/23 [phone]

## 2023-11-27 MED ORDER — HYDROCOD POLI-CHLORPHE POLI ER 10-8 MG/5ML PO SUER: 5.0000 mL | Freq: Two times a day (BID) | ORAL | 0 refills | Status: DC | PRN

## 2023-11-27 MED ORDER — FLUTICASONE PROPIONATE 50 MCG/ACT NA SUSP: 2.0000 | Freq: Two times a day (BID) | NASAL | 6 refills | Status: DC

## 2023-11-27 MED ORDER — AZELASTINE HCL 0.1 % NA SOLN: 2.0000 | Freq: Two times a day (BID) | NASAL | 12 refills | Status: AC

## 2023-11-27 MED ORDER — LEVOCETIRIZINE DIHYDROCHLORIDE 5 MG PO TABS: 5.0000 mg | ORAL_TABLET | Freq: Every evening | ORAL | 0 refills | Status: DC

## 2023-11-27 NOTE — Addendum Note (Signed)
 Addended by: Alayjah Boehringer M on: 11/27/2023 04:33 PM   Modules accepted: Orders

## 2023-11-27 NOTE — Telephone Encounter (Signed)
 Copied from CRM [id number]. Topic: Clinical - Medical Advice >> Nov 27, 2023  4:06 PM Albertha Alosa wrote: Reason for CRM: Patient called in stating she is still feeling bad has a very bad cough and can not stop coughing would like for Loetta Ringer or Dr.Walsh to call her regarding this   Would like a callback  [phone]

## 2023-11-28 NOTE — Telephone Encounter (Signed)
 Left message to return call to our office.  Ok to relay message to pt. Please document when pt is spoke to.

## 2023-11-29 ENCOUNTER — Telehealth: Payer: Self-pay

## 2023-11-29 NOTE — Telephone Encounter (Signed)
 Left message to return call to our office.

## 2023-11-29 NOTE — Telephone Encounter (Signed)
 Noted.

## 2023-11-29 NOTE — Telephone Encounter (Signed)
 See previous message

## 2023-11-29 NOTE — Telephone Encounter (Signed)
 Copied from CRM [id number]. Topic: General - Other >> Nov 29, 2023 10:05 AM Adonis Hoot wrote: Reason for CRM: Patient returned call to [facility].Patient stated that she had already spoken to someone yesterday regarding cough medication.

## 2023-12-01 ENCOUNTER — Ambulatory Visit
Admission: RE | Admit: 2023-12-01 | Discharge: 2023-12-01 | Disposition: A | Discharge status: Home / Self Care | Source: Ambulatory Visit

## 2023-12-01 VITALS — BP 143/87 | HR 70 | Temp 98.1°F | Ht 64.0 in | Wt 190.0 lb

## 2023-12-01 DIAGNOSIS — J069 Acute upper respiratory infection, unspecified: Secondary | ICD-10-CM

## 2023-12-01 MED ORDER — AMOXICILLIN-POT CLAVULANATE 875-125 MG PO TABS: 1.0000 | ORAL_TABLET | Freq: Two times a day (BID) | ORAL | 0 refills | Status: DC

## 2023-12-01 NOTE — Discharge Instructions (Addendum)
 Take Augmentin  every morning and every evening for 7 days    You can take Tylenol  and/or Ibuprofen as needed for fever reduction and pain relief.   For cough: honey 1/2 to 1 teaspoon (you can dilute the honey in water or another fluid).  You can also use guaifenesin  and dextromethorphan for cough. You can use a humidifier for chest congestion and cough.  If you don't have a humidifier, you can sit in the bathroom with the hot shower running.      For sore throat: try warm salt water gargles, cepacol lozenges, throat spray, warm tea or water with lemon/honey, popsicles or ice, or OTC cold relief medicine for throat discomfort.   For congestion: take a daily anti-histamine like Zyrtec, Claritin, and a oral decongestant, such as pseudoephedrine.  You can also use Flonase  1-2 sprays in each nostril daily.   It is important to stay hydrated: drink plenty of fluids (water, gatorade/powerade/pedialyte, juices, or teas) to keep your throat moisturized and help further relieve irritation/discomfort.

## 2023-12-01 NOTE — ED Provider Notes (Signed)
 [name]    CSN: [id number] Arrival date & time: 12/01/23  0940      History   Chief Complaint Chief Complaint  Patient presents with   Cough    HPI Breanna Farrell is a 68 y.o. female.   Patient presents for evaluation of nasal congestion, rhinorrhea, sore throat, nonproductive cough, shortness of breath experienced only after coughing, intermittent sinus pressure and increased fatigue present for 10 days.  Sick contacts in household.  Has been evaluated in this urgent care and by PCP, COVID testing negative, has been given hydrocodone  cough syrup and Tessalon  which has been minimally effective.  Tolerating food and liquids.  Denies presence of fever or wheezing.  Past Medical History:  Diagnosis Date   Abnormal cervical Pap smear with positive HPV DNA test 02/01/2012   Allergy    Tramadol   Anxiety    Arthritis 2014   rheumatoid   Articular cartilage disorder    bulging & herniated disc in lower back   Benign essential hypertension    Breast tenderness    Bronchitis 06/05/2017   Bulging discs 2012   Colitis 2007   ulcerative   Depression    Esophageal reflux    Family history of adverse reaction to anesthesia    MOM-N/V   Hernia 2007   hiatal   History of hiatal hernia    SMALL PER PT   Hypertension 2007   Pap smear abnormality of cervix with LGSIL 07/18/2012   BVD   Psoriatic arthritis (HCC)    Rheumatoid arthritis (HCC)     Patient Active Problem List   Diagnosis Date Noted   Lumbosacral radiculitis 11/21/2023   History of squamous cell carcinoma 11/03/2023   Pain in left knee 07/13/2023   Unilateral primary osteoarthritis, left knee 06/13/2023   SLE (systemic lupus erythematosus) (HCC) 05/19/2023   RA (rheumatoid arthritis) (HCC) 05/19/2023   Hypertension associated with diabetes (HCC) 05/19/2023   Vitamin D  deficiency 05/19/2023   Vitamin B 12 deficiency 05/19/2023   Need for hepatitis C screening test 05/19/2023   Breast cancer  screening by mammogram 05/19/2023   Hypertriglyceridemia 05/19/2023   Osteoarthritis 05/19/2023   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   Presence of right artificial hip joint 03/29/2022   Unilateral primary osteoarthritis, right hip 08/29/2021   Lumbar spondylosis 03/17/2021   Lumbar radiculopathy 03/08/2021   Trochanteric bursitis of right hip 03/08/2021   Psoriasis 01/13/2021   Steatosis of liver 01/13/2021   Colitis 08/19/2018   Abdominal pain, chronic, epigastric    Stomach irritation    Intractable vomiting with nausea    Special screening for malignant neoplasms, colon    External hemorrhoids    Angiodysplasia of intestinal tract    Benign neoplasm of descending colon    Benign neoplasm of cecum    Rectal polyp    Polyp of sigmoid colon    Epigastric discomfort 03/26/2018   Osteoporosis 02/27/2018   Leukocytosis 09/14/2017   Elevated LFTs 09/14/2017   Carpal tunnel syndrome 07/18/2017   Lower extremity edema 01/26/2017   Anxiety and depression 01/26/2017   GERD (gastroesophageal reflux disease) 11/07/2016   Psoriatic arthritis (HCC) 03/16/2015   History of osteoporosis 01/06/2015   Hyperlipidemia associated with type 2 diabetes mellitus (HCC) 01/06/2015   Essential hypertension 01/06/2015   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/06/2015    Past Surgical History:  Procedure Laterality Date   ABDOMINAL HYSTERECTOMY  1986   [facility]   BACK SURGERY  12/27/2012   [facility]   BREAST BIOPSY Left 11/15/2017   x shape marker path pending UOQ   BREAST BIOPSY Left 11/15/2017   coil shape marker path pending LOQ   CARPAL TUNNEL RELEASE Right 07/10/2017   Procedure: CARPAL TUNNEL RELEASE;  Surgeon: Rande Bushy, MD;  Location: [facility];  Service: Orthopedics;  Laterality: Right;   CESAREAN SECTION  1981   CESAREAN SECTION  1981   [facility]   CHOLECYSTECTOMY  2007   [facility]   COLONOSCOPY  2010   COLONOSCOPY WITH PROPOFOL  N/A  04/11/2018   Procedure: COLONOSCOPY WITH PROPOFOL ;  Surgeon: Irby Mannan, MD;  Location: [facility];  Service: Endoscopy;  Laterality: N/A;   COLPOSCOPY     ESOPHAGOGASTRODUODENOSCOPY (EGD) WITH PROPOFOL  N/A 04/11/2018   Procedure: ESOPHAGOGASTRODUODENOSCOPY (EGD) WITH PROPOFOL ;  Surgeon: Irby Mannan, MD;  Location: [facility];  Service: Endoscopy;  Laterality: N/A;   JOINT REPLACEMENT  2023   Right hip replacement   POLYPECTOMY  2007   one   SPINE SURGERY  2022   Multi level fusion, laminectomy   UPPER GI ENDOSCOPY  2010    OB History   No obstetric history on file.      Home Medications    Prior to Admission medications   Medication Sig Start Date End Date Taking? Authorizing Provider  amoxicillin -clavulanate (AUGMENTIN ) 875-125 MG tablet Take 1 tablet by mouth every 12 (twelve) hours. 12/01/23  Yes Jariana Shumard R, NP  methylPREDNISolone (MEDROL DOSEPAK) 4 MG TBPK tablet SMARTSIG:- Tablet(s) By Mouth - 11/28/23  Yes [provider]  amLODipine  (NORVASC ) 2.5 MG tablet Take 1 tablet (2.5 mg total) by mouth daily. 10/25/23  Yes Valli Gaw, MD  azelastine  (ASTELIN ) 0.1 % nasal spray Place 2 sprays into both nostrils 2 (two) times daily. Use in each nostril as directed 11/27/23  Yes Valli Gaw, MD  benzonatate  (TESSALON ) 100 MG capsule Take 1 capsule (100 mg total) by mouth 3 (three) times daily as needed for cough. 11/23/23  Yes Wellington Half, NP  Calcium  Carbonate-Vit D-Min (CALCIUM  1200 PO) Take 1,200 mg by mouth daily.   Yes [provider]  celecoxib (CELEBREX) 100 MG capsule Take 100 mg by mouth as needed. 01/17/23  Yes [provider]  cetirizine (ZYRTEC) 10 MG tablet Take 10 mg by mouth daily.    [provider]  chlorpheniramine-HYDROcodone  (TUSSIONEX) 10-8 MG/5ML Take 5 mLs by mouth every 12 (twelve) hours as needed for cough. 11/27/23  Yes Walsh, Tanya, MD  Cholecalciferol (VITAMIN D -3) 1000 units CAPS Take 2,000  Units by mouth daily.    Yes [provider]  colestipol  (COLESTID ) 1 g tablet Take 1 tablet (1 g total) by mouth 2 (two) times daily. 10/25/23  Yes Valli Gaw, MD  ezetimibe  (ZETIA ) 10 MG tablet Take 1 tablet (10 mg total) by mouth daily. 11/21/23  Yes Valli Gaw, MD  fluticasone  (FLONASE ) 50 MCG/ACT nasal spray Place 2 sprays into both nostrils in the morning and at bedtime. 11/27/23  Yes Valli Gaw, MD  folic acid (FOLVITE) 800 MCG tablet Take 800 mcg by mouth daily.   Yes [provider]  ketoconazole (NIZORAL) 2 % cream Apply 1 Application topically as needed.   Yes [provider]  levocetirizine (XYZAL ) 5 MG tablet Take 1 tablet (5 mg total) by mouth every evening. 11/27/23  Yes Valli Gaw, MD  montelukast (SINGULAIR) 10 MG tablet Take 10 mg by mouth at bedtime. 01/26/20  Yes [provider]  Omega-3 Fatty Acids (SM FISH OIL) 1000 MG CAPS Take 1,000 mg by mouth daily.    Yes [provider]  pantoprazole  (PROTONIX ) 40 MG tablet Take 40 mg by mouth daily. 09/02/23  Yes [provider]  triamterene -hydrochlorothiazide (DYAZIDE) 37.5-25 MG capsule Take 1 each (1 capsule total) by mouth daily. 10/25/23  Yes Valli Gaw, MD  Upadacitinib ER (RINVOQ) 15 MG TB24 Take 15 mg by mouth daily. 09/14/22  Yes [provider]  vitamin E 1000 UNIT capsule Take 1,000 Units by mouth daily. 01/26/20  Yes [provider]    Family History Family History  Problem Relation Age of Onset   Melanoma Mother    Hypertension Mother    Arthritis Mother    Kidney disease Mother    Ovarian cancer Maternal Aunt    Lung cancer Maternal Grandmother        Great Grandmother   Ovarian cancer Maternal Grandmother        Great Grandmother   Heart disease Maternal Grandfather    Leukemia Paternal Grandmother    Breast cancer Neg Hx     Social History Social History   Tobacco Use   Smoking status: Former    Current packs/day: 0.00    Average  packs/day: 0.7 packs/day for 20.0 years (14.0 ttl pk-yrs)    Types: Cigarettes    Start date: 10/24/1988    Quit date: 10/24/2008    Years since quitting: 15.1   Smokeless tobacco: Never  Vaping Use   Vaping status: Never Used  Substance Use Topics   Alcohol use: Not Currently    Comment: SOCIALLY   Drug use: No     Allergies   Hepatitis b vaccine, Metoclopramide, Hydroxychloroquine, Lyrica [pregabalin], Mycophenolate mofetil, and Tramadol   Review of Systems Review of Systems  Respiratory:  Positive for cough.      Physical Exam Triage Vital Signs ED Triage Vitals  Encounter Vitals Group     BP 12/01/23 1026 (!) 143/87     Systolic BP Percentile --      Diastolic BP Percentile --      Pulse Rate 12/01/23 1026 70     Resp --      Temp 12/01/23 1026 98.1 F (36.7 C)     Temp Source 12/01/23 1026 Oral     SpO2 12/01/23 1026 95 %     Weight 12/01/23 1025 190 lb (86.2 kg)     Height 12/01/23 1025 5\' 4"  (1.626 m)     Head Circumference --      Peak Flow --      Pain Score 12/01/23 1025 6     Pain Loc --      Pain Education --      Exclude from Growth Chart --    No data found.  Updated Vital Signs BP (!) 143/87 (BP Location: Left Arm)   Pulse 70   Temp 98.1 F (36.7 C) (Oral)   Ht 5\' 4"  (1.626 m)   Wt 190 lb (86.2 kg)   SpO2 95%   BMI 32.61 kg/m   Visual Acuity Right Eye Distance:   Left Eye Distance:   Bilateral Distance:    Right Eye Near:   Left Eye Near:    Bilateral Near:     Physical Exam Constitutional:      Appearance: Normal appearance.  HENT:     Head: Normocephalic.     Right Ear: Tympanic membrane, ear canal and external ear normal.  Left Ear: Tympanic membrane, ear canal and external ear normal.     Nose: Congestion present.     Mouth/Throat:     Mouth: Mucous membranes are moist.     Pharynx: No oropharyngeal exudate or posterior oropharyngeal erythema.  Eyes:     Extraocular Movements: Extraocular movements intact.   Cardiovascular:     Rate and Rhythm: Normal rate and regular rhythm.     Pulses: Normal pulses.     Heart sounds: Normal heart sounds.  Pulmonary:     Effort: Pulmonary effort is normal.     Breath sounds: Normal breath sounds.  Musculoskeletal:     Cervical back: Normal range of motion and neck supple.  Neurological:     Mental Status: She is alert and oriented to person, place, and time. Mental status is at baseline.      UC Treatments / Results  Labs (all labs ordered are listed, but only abnormal results are displayed) Labs Reviewed - No data to display  EKG   Radiology No results found.  Procedures Procedures (including critical care time)  Medications Ordered in UC Medications - No data to display  Initial Impression / Assessment and Plan / UC Course  I have reviewed the triage vital signs and the nursing notes.  Pertinent labs & imaging results that were available during my care of the patient were reviewed by me and considered in my medical decision making (see chart for details).  Acute URI  Patient is in no signs of distress nor toxic appearing.  Vital signs are stable.  Low suspicion for pneumonia, pneumothorax or bronchitis and therefore will defer imaging.  Symptoms present for 10 days will provide bacterial coverage, Augmentin  prescribed.May use additional over-the-counter medications as needed for supportive care.  May follow-up with urgent care as needed if symptoms persist or worsen.    Final Clinical Impressions(s) / UC Diagnoses   Final diagnoses:  Acute URI     Discharge Instructions      Take Augmentin  every morning and every evening for 7 days    You can take Tylenol  and/or Ibuprofen as needed for fever reduction and pain relief.   For cough: honey 1/2 to 1 teaspoon (you can dilute the honey in water or another fluid).  You can also use guaifenesin  and dextromethorphan for cough. You can use a humidifier for chest congestion and cough.   If you don't have a humidifier, you can sit in the bathroom with the hot shower running.      For sore throat: try warm salt water gargles, cepacol lozenges, throat spray, warm tea or water with lemon/honey, popsicles or ice, or OTC cold relief medicine for throat discomfort.   For congestion: take a daily anti-histamine like Zyrtec, Claritin, and a oral decongestant, such as pseudoephedrine.  You can also use Flonase  1-2 sprays in each nostril daily.   It is important to stay hydrated: drink plenty of fluids (water, gatorade/powerade/pedialyte, juices, or teas) to keep your throat moisturized and help further relieve irritation/discomfort.    ED Prescriptions     Medication Sig Dispense Auth. Provider   amoxicillin -clavulanate (AUGMENTIN ) 875-125 MG tablet Take 1 tablet by mouth every 12 (twelve) hours. 14 tablet Derik Fults R, NP      PDMP not reviewed this encounter.   Reena Canning, [place] 12/01/23 [phone]

## 2023-12-01 NOTE — ED Triage Notes (Signed)
 Pt c/o Cough, fatigue, headache x10days  Pt states that she was sent Hydrocodone  cough syrup from her PCP but it has not helped  Pt was seen by the urgent care on 11/23/23

## 2023-12-02 ENCOUNTER — Encounter: Payer: Self-pay | Admitting: Family Medicine

## 2023-12-02 DIAGNOSIS — Z780 Asymptomatic menopausal state: Secondary | ICD-10-CM | POA: Insufficient documentation

## 2023-12-02 DIAGNOSIS — J069 Acute upper respiratory infection, unspecified: Secondary | ICD-10-CM | POA: Insufficient documentation

## 2023-12-02 NOTE — Assessment & Plan Note (Signed)
 Elevated triglycerides and cholesterol. Unable to tolerate statins. Discussed alternative treatments. - Start Zetia  10 mg daily. - Consider adding fenofibrate if needed. - Continue colestipol   - Consider Repatha in future

## 2023-12-02 NOTE — Assessment & Plan Note (Signed)
 Discontinued metformin  due to side effects. Discussed alternative medications and cost concerns. Emphasized maintaining A1c below 7%. - Consider GLP1 in future - Patient prefers  to manage with diet and exercise for three months; re-evaluate A1c.

## 2023-12-02 NOTE — Assessment & Plan Note (Signed)
 Symptoms suggest viral etiology. No antibiotics or steroids needed. - Advise DayQuil, honey, teas for symptom relief. - Monitor for fever, shortness of breath, wheezing; re-evaluate if symptoms worsen.

## 2023-12-02 NOTE — Assessment & Plan Note (Signed)
 Well-controlled  - Continue amlodipine  and triamterene .

## 2023-12-19 ENCOUNTER — Other Ambulatory Visit: Payer: Self-pay | Admitting: Family Medicine

## 2023-12-19 DIAGNOSIS — R0981 Nasal congestion: Secondary | ICD-10-CM

## 2024-04-09 ENCOUNTER — Telehealth: Payer: Self-pay

## 2024-04-09 NOTE — Telephone Encounter (Signed)
 Copied from CRM #[id number]. Topic: Appointments - Scheduling Inquiry for Clinic >> Apr 09, 2024  4:30 PM Burnard DEL wrote: Reason for CRM: Patient is [facility] with Dr Abbey on October 15th.She is schedule to have surgery for a total knee replacement on next week September 11th. She needs to come in to have a surgical clearance form completed before then.Provider next OV is the day before her surgery at 4pm. Patient would like to know is the any way that she could be fitted in on her schedule to have this surgical clearance appointment completed?

## 2024-04-09 NOTE — Telephone Encounter (Signed)
 She can be scheduled for surgical clearance with anyone of us  here, doesn't have to be me.   Luke Shade, MD

## 2024-04-10 NOTE — Telephone Encounter (Signed)
 Copied from CRM [id number]. Topic: Appointments - Scheduling Inquiry for Clinic >> Apr 10, 2024 10:37 AM Dedra B wrote: Reason for CRM: Pt is having R knee replacement surgery on 9/11. She needs medical clearance by Monday, 9/8 maybe 9/9 at the latest. She was a previous pt of Dr. Hope and her [facility] appt with Dr. Abbey isn't until October. Called [facility] and was told to schedule her with Dr. Narendra for the clearance but his next available isn't until 9/15, which is after the surgery date.  Patient has been scheduled for an appointment on 04/15/2024 with Leron Glance, FNP-C.

## 2024-04-10 NOTE — Telephone Encounter (Signed)
 Leron Glance, FNP-C, had a spot open up, so I scheduled patient with her on 04/15/2024, and I added her to the wait list.

## 2024-04-15 ENCOUNTER — Ambulatory Visit: Admitting: Nurse Practitioner

## 2024-04-15 ENCOUNTER — Encounter: Payer: Self-pay | Admitting: Nurse Practitioner

## 2024-04-15 DIAGNOSIS — Z01818 Encounter for other preprocedural examination: Secondary | ICD-10-CM

## 2024-04-15 DIAGNOSIS — I10 Essential (primary) hypertension: Secondary | ICD-10-CM | POA: Diagnosis not present

## 2024-04-15 DIAGNOSIS — I480 Paroxysmal atrial fibrillation: Secondary | ICD-10-CM | POA: Diagnosis not present

## 2024-04-15 DIAGNOSIS — E118 Type 2 diabetes mellitus with unspecified complications: Secondary | ICD-10-CM

## 2024-04-15 DIAGNOSIS — M1711 Unilateral primary osteoarthritis, right knee: Secondary | ICD-10-CM

## 2024-04-15 LAB — POCT GLYCOSYLATED HEMOGLOBIN (HGB A1C): Hemoglobin A1C: 6.3 % — AB (ref 4.0–5.6)

## 2024-04-15 NOTE — Assessment & Plan Note (Signed)
 POCT A1c today- 6.3. Not on any medications. Managed with healthy diet. Continue.

## 2024-04-15 NOTE — Assessment & Plan Note (Addendum)
 New onset of paroxysmal atrial fibrillation was detected during pre-surgical EKG. Anxiety may contribute to episodes. She is not cleared for surgery pending cardiology evaluation. An urgent referral to cardiology, Zio heart monitor, and starting anticoagulation was offered, she politely declined all interventions today. She is advised to seek emergency care for symptoms such as shortness of breath, chest pain, or palpitations. Encouraged evaluation by cardiology. Education and counseling provided on risks of a-fib. She will contact when ready for further evaluation.

## 2024-04-15 NOTE — Assessment & Plan Note (Signed)
 She has a severe right knee injury with a complex meniscal tear and fracture, compounded by osteoarthritis. Surgery is postponed due to atrial fibrillation concerns until cardiology clearance is obtained.

## 2024-04-15 NOTE — Assessment & Plan Note (Signed)
 Blood pressure managed with Norvasc  2.5 mg daily and Triamterene -hydrochlorothiazide 37.5-25 mg daily. Continue.

## 2024-04-15 NOTE — Progress Notes (Signed)
 Leron Glance, NP-C Phone: [phone]  Breanna Farrell is a 68 y.o. female who presents today for surgical clearance.   Discussed the use of AI scribe software for clinical note transcription with the patient, who gave verbal consent to proceed.  History of Present Illness   Breanna Farrell is a 68 year old female with rheumatoid arthritis and osteoarthritis who presents for surgical clearance for a right total knee replacement.  She is scheduled for a right total knee replacement in two days due to a traumatic injury sustained while moving into a new house. She tripped over a vacuum cord, resulting in a trauma fracture, impact fracture, and a complex meniscus tear in her right knee. Despite efforts to manage the injury over the summer, including oral steroids for pain and inflammation, the tear is too severe for repair, necessitating a total knee replacement.  She has a history of rheumatoid arthritis, psoriatic arthritis, and markers for lupus, which have contributed to her joint issues. She has been on steroids frequently for her arthritis, which have caused significant weight gain. She also has a history of osteoarthritis and a bone spur in the affected knee.  Her past medical history includes type 2 diabetes, though she mentions conflicting reports about this diagnosis. She has a history of gallbladder removal in 2007 due to stones and subsequent colitis. Her autoimmune conditions were diagnosed around 2012. She has a family history of high cholesterol, high triglycerides, and diabetes from her father.  She reports a healthy diet, preferring salads and fruits, and denies being a big sweet eater. She has experienced significant weight gain attributed to steroid use. She has been unable to take statins due to adverse reactions and is not currently on Zetia  due to severe side effects.  She completed a full cardiac workup last year, including a nuclear stress test, which showed normal results.  She reports feeling anxious and attributes her symptoms to stress and recent steroid use. No heart palpitations, shortness of breath, or fatigue, except during the initial days of steroid use.     Social History   Tobacco Use  Smoking Status Former   Current packs/day: 0.00   Average packs/day: 0.7 packs/day for 20.0 years (14.0 ttl pk-yrs)   Types: Cigarettes   Start date: 10/24/1988   Quit date: 10/24/2008   Years since quitting: 15.4  Smokeless Tobacco Never    Current Outpatient Medications on File Prior to Visit  Medication Sig Dispense Refill   amLODipine  (NORVASC ) 2.5 MG tablet Take 1 tablet (2.5 mg total) by mouth daily. 90 tablet 3   azelastine  (ASTELIN ) 0.1 % nasal spray Place 2 sprays into both nostrils 2 (two) times daily. Use in each nostril as directed 30 mL 12   Calcium  Carbonate-Vit D-Min (CALCIUM  1200 PO) Take 1,200 mg by mouth daily.     celecoxib (CELEBREX) 100 MG capsule Take 100 mg by mouth as needed.     cetirizine (ZYRTEC) 10 MG tablet Take 10 mg by mouth daily.     Cholecalciferol (VITAMIN D -3) 1000 units CAPS Take 2,000 Units by mouth daily.      colestipol  (COLESTID ) 1 g tablet Take 1 tablet (1 g total) by mouth 2 (two) times daily. 180 tablet 3   ezetimibe  (ZETIA ) 10 MG tablet Take 1 tablet (10 mg total) by mouth daily. 90 tablet 3   folic acid (FOLVITE) 800 MCG tablet Take 800 mcg by mouth daily.     ketoconazole (NIZORAL) 2 % cream Apply 1 Application  topically as needed.     montelukast (SINGULAIR) 10 MG tablet Take 10 mg by mouth at bedtime.     Omega-3 Fatty Acids (SM FISH OIL) 1000 MG CAPS Take 1,000 mg by mouth daily.      pantoprazole  (PROTONIX ) 40 MG tablet Take 40 mg by mouth daily.     triamterene -hydrochlorothiazide (DYAZIDE) 37.5-25 MG capsule Take 1 each (1 capsule total) by mouth daily. 90 capsule 3   Upadacitinib ER (RINVOQ) 15 MG TB24 Take 15 mg by mouth daily.     vitamin E 1000 UNIT capsule Take 1,000 Units by mouth daily.     No current  facility-administered medications on file prior to visit.     ROS see history of present illness  Objective  Physical Exam Vitals:   04/15/24 1039  BP: 136/86  Pulse: 73  Temp: 98.4 F (36.9 C)  SpO2: 98%    BP Readings from Last 3 Encounters:  04/15/24 136/86  12/01/23 (!) 143/87  11/23/23 133/81   Wt Readings from Last 3 Encounters:  04/15/24 191 lb 9.6 oz (86.9 kg)  12/01/23 190 lb (86.2 kg)  11/21/23 193 lb 6 oz (87.7 kg)    Physical Exam Constitutional:      General: She is not in acute distress.    Appearance: Normal appearance.  HENT:     Head: Normocephalic.  Cardiovascular:     Rate and Rhythm: Normal rate and regular rhythm.     Heart sounds: Normal heart sounds.  Pulmonary:     Effort: Pulmonary effort is normal.     Breath sounds: Normal breath sounds.  Skin:    General: Skin is warm and dry.  Neurological:     General: No focal deficit present.     Mental Status: She is alert.  Psychiatric:        Mood and Affect: Mood normal.        Behavior: Behavior normal.      Assessment/Plan: Please see individual problem list.  Paroxysmal atrial fibrillation (HCC) Assessment & Plan: New onset of paroxysmal atrial fibrillation was detected during pre-surgical EKG. Anxiety may contribute to episodes. She is not cleared for surgery pending cardiology evaluation. An urgent referral to cardiology, Zio heart monitor, and starting anticoagulation was offered, she politely declined all interventions today. She is advised to seek emergency care for symptoms such as shortness of breath, chest pain, or palpitations. Encouraged evaluation by cardiology. Education and counseling provided on risks of a-fib. She will contact when ready for further evaluation.    Osteoarthritis of right knee, unspecified osteoarthritis type Assessment & Plan: She has a severe right knee injury with a complex meniscal tear and fracture, compounded by osteoarthritis. Surgery is  postponed due to atrial fibrillation concerns until cardiology clearance is obtained.   Type 2 diabetes mellitus with complications (HCC) Assessment & Plan: POCT A1c today- 6.3. Not on any medications. Managed with healthy diet. Continue.   Orders: -     POCT glycosylated hemoglobin (Hb A1C)  Pre-procedural examination -     EKG 12-Lead -     EKG 12-Lead  Essential hypertension Assessment & Plan: Blood pressure managed with Norvasc  2.5 mg daily and Triamterene -hydrochlorothiazide 37.5-25 mg daily. Continue.   Orders: -     EKG 12-Lead    Return for as scheduled.   Leron Glance, NP-C [facility]

## 2024-05-21 ENCOUNTER — Encounter

## 2024-06-26 ENCOUNTER — Other Ambulatory Visit: Payer: Self-pay | Admitting: Internal Medicine

## 2024-06-26 DIAGNOSIS — Z1231 Encounter for screening mammogram for malignant neoplasm of breast: Secondary | ICD-10-CM
